# Patient Record
Sex: Female | Born: 1985
Health system: Southern US, Community
[De-identification: ages and names within clinical notes are randomized; demographics above are authoritative.]

## PROBLEM LIST (undated history)

## (undated) DIAGNOSIS — K30 Functional dyspepsia: Secondary | ICD-10-CM

## (undated) DIAGNOSIS — J302 Other seasonal allergic rhinitis: Secondary | ICD-10-CM

## (undated) DIAGNOSIS — R5383 Other fatigue: Secondary | ICD-10-CM

## (undated) DIAGNOSIS — Z8719 Personal history of other diseases of the digestive system: Secondary | ICD-10-CM

## (undated) DIAGNOSIS — G43909 Migraine, unspecified, not intractable, without status migrainosus: Secondary | ICD-10-CM

## (undated) DIAGNOSIS — K219 Gastro-esophageal reflux disease without esophagitis: Secondary | ICD-10-CM

## (undated) DIAGNOSIS — E559 Vitamin D deficiency, unspecified: Secondary | ICD-10-CM

## (undated) DIAGNOSIS — F419 Anxiety disorder, unspecified: Secondary | ICD-10-CM

## (undated) HISTORY — DX: Other fatigue: R53.83

## (undated) HISTORY — DX: Anxiety disorder, unspecified: F41.9

## (undated) HISTORY — PX: DILATION AND CURETTAGE OF UTERUS: SHX78

## (undated) HISTORY — DX: Other seasonal allergic rhinitis: J30.2

## (undated) HISTORY — DX: Functional dyspepsia: K30

## (undated) HISTORY — DX: Migraine, unspecified, not intractable, without status migrainosus: G43.909

## (undated) HISTORY — DX: Vitamin D deficiency, unspecified: E55.9

---

## 2007-09-10 ENCOUNTER — Emergency Department: Payer: Self-pay | Admitting: Emergency Medicine

## 2009-01-21 ENCOUNTER — Inpatient Hospital Stay: Payer: Self-pay

## 2013-02-19 ENCOUNTER — Ambulatory Visit: Payer: Self-pay | Admitting: Obstetrics and Gynecology

## 2013-02-19 LAB — CBC
HCT: 37.2 % (ref 35.0–47.0)
MCHC: 34 g/dL (ref 32.0–36.0)
MCV: 97 fL (ref 80–100)
Platelet: 261 10*3/uL (ref 150–440)
RBC: 3.84 10*6/uL (ref 3.80–5.20)
WBC: 7.1 10*3/uL (ref 3.6–11.0)

## 2013-11-02 ENCOUNTER — Ambulatory Visit: Payer: Self-pay | Admitting: Obstetrics and Gynecology

## 2013-11-02 LAB — HEMOGLOBIN: HGB: 12.4 g/dL (ref 12.0–16.0)

## 2013-11-07 LAB — PATHOLOGY REPORT

## 2014-02-11 HISTORY — PX: ESOPHAGOGASTRODUODENOSCOPY: SHX1529

## 2014-02-25 ENCOUNTER — Ambulatory Visit: Payer: Self-pay | Admitting: Gastroenterology

## 2014-09-13 NOTE — L&D Delivery Note (Signed)
Delivery Note At  a viable and healthy female (Carolyn Barnes)was delivered via  (Presentation:OA ;  ).  APGAR:8 ,9 ; weight  .   Placenta status:delivered ,with three vessel Cord:  with the following complications:none .  Anesthesia:  none Episiotomy:  none Lacerations:  none Est. Blood Loss (mL):  250  Mom to postpartum.  Baby to Couplet care / Skin to Skin.  Melody Burr 05/30/2015, 12:16 AM

## 2014-11-05 LAB — OB RESULTS CONSOLE HGB/HCT, BLOOD
HEMATOCRIT: 36 %
Hemoglobin: 12 g/dL

## 2014-11-05 LAB — OB RESULTS CONSOLE VARICELLA ZOSTER ANTIBODY, IGG: Varicella: IMMUNE

## 2014-11-05 LAB — OB RESULTS CONSOLE RUBELLA ANTIBODY, IGM: Rubella: IMMUNE

## 2014-11-05 LAB — OB RESULTS CONSOLE ABO/RH: RH Type: POSITIVE

## 2014-11-05 LAB — OB RESULTS CONSOLE HIV ANTIBODY (ROUTINE TESTING): HIV: NONREACTIVE

## 2014-11-05 LAB — OB RESULTS CONSOLE HEPATITIS B SURFACE ANTIGEN: Hepatitis B Surface Ag: NEGATIVE

## 2014-11-05 LAB — OB RESULTS CONSOLE GC/CHLAMYDIA
Chlamydia: NEGATIVE
GC PROBE AMP, GENITAL: NEGATIVE

## 2014-11-05 LAB — OB RESULTS CONSOLE PLATELET COUNT: Platelets: 242 10*3/uL

## 2014-11-05 LAB — OB RESULTS CONSOLE RPR: RPR: NONREACTIVE

## 2014-11-05 LAB — OB RESULTS CONSOLE ANTIBODY SCREEN: ANTIBODY SCREEN: NEGATIVE

## 2014-12-11 ENCOUNTER — Encounter: Payer: Self-pay | Admitting: *Deleted

## 2015-01-03 NOTE — Op Note (Signed)
PATIENT NAME:  Carolyn Barnes, Carolyn Barnes MR#:  [id number] DATE OF BIRTH:  1985/12/18  DATE OF PROCEDURE:  02/19/2013  PREOPERATIVE DIAGNOSES:  1. Missed abortion.  2. Rh positive blood type.   POSTOPERATIVE DIAGNOSES:  1. Missed abortion.  2. Rh positive blood type.  3. Incidental cervical laceration.   OPERATION: Suction dilation and curettage and repair of cervical laceration.   SURGEON: Alanda Slim. Sundra Haddix, MD  FIRST ASSISTANT: Imogene Burn, PA-S   ANESTHESIA: General.   INDICATIONS: The patient is a 29 year old African-American female, gravida 2, para 1-0-0-1, at 12+ weeks' gestation, who presents for surgical management of missed abortion. The patient had empty gestational sac on ultrasound along with quantitative hCG titer of 9800. The patient did have some mild cramping and spotting. She did not pass tissue.   FINDINGS AT SURGERY: Revealed uterus that was approximately 10-week size in mid plane. The uterus sounded to 12 cm. Average amount of POC was obtained on curettage. The os was open to #8 [place] dilator.   DESCRIPTION OF PROCEDURE: The patient was brought to the operating room, where she was placed in the supine position. General anesthesia was induced without difficulty. She was placed in dorsal lithotomy position using the candy-cane stirrups. A Betadine perineal and intravaginal prep and drape was performed in the standard fashion. Red Robinson catheter was used to drain 50 mL of urine from the bladder. A weighted speculum was placed into the vagina, and a single-tooth tenaculum was placed on the anterior lip of the cervix. The uterus was sounded to 12 cm. The Hanks dilators up to a #20 [place] caliber were used to dilate the endocervical canal. A #10 suction curette was used with several passes to remove the products of conception. During the curettage, the cervix was noted to be soft and friable, and several lacerations were noted with tenaculum placement. These had moderate  amount of bleeding which required 2-0 chromic suturing at the end of the case. Good hemostasis was obtained. Once completed with the procedure and noting that hemostasis was achieved, the procedure was terminated, with all instrumentation being removed from the vagina. The patient was then awakened, mobilized and taken to the recovery room in satisfactory condition.   ESTIMATED BLOOD LOSS: 150 mL.   INTRAVENOUS FLUIDS: 800 mL.   URINE OUTPUT: 50 mL.  ALL INSTRUMENTS, NEEDLE AND SPONGE COUNTS: Verified as correct.    ____________________________ Alanda Slim. Anchor Dwan, MD mad:OSi D: 02/19/2013 18:59:13 ET T: 02/20/2013 07:05:08 ET JOB#: [id number]  cc: Hassell Done A. Ashon Rosenberg, MD, <Dictator> [facility] MD ELECTRONICALLY SIGNED 02/20/2013 15:06

## 2015-01-04 NOTE — Op Note (Signed)
PATIENT NAME:  Carolyn Barnes, ABUNDIS MR#:  [id number] DATE OF BIRTH:  August 20, 1986  DATE OF PROCEDURE:  11/02/2013  PREOPERATIVE DIAGNOSIS: Missed abortion at 5.5 weeks' gestation.   POSTOPERATIVE DIAGNOSIS: Missed abortion at 5.5 weeks' gestation.   OPERATION: Suction dilation and curettage.   ANESTHESIA: General LMA.   SURGEON: Leldon Steege S. Marcelline Mates, M.D.   ESTIMATED BLOOD LOSS: 150 mL.   OPERATIVE FLUIDS: 800 mL.   URINE OUTPUT: 100 mL.   COMPLICATIONS: There were none.   FINDINGS: Included products of conception and uterus sounding to approximately 9 cm.   SPECIMENS: Included products of conception.   CONDITION: Stable.    PROCEDURE: The patient was taken to the operating room where she was placed under general anesthesia without difficulty. She was then prepped and draped in a sterile fashion and placed in the dorsal lithotomy position. A sterile speculum was then placed into the patient's vagina. After this, a single-tooth tenaculum was used to grasp the anterior lip of the cervix, and the cervical os was dilated up to approximately 18. A size 6 curette was then passed into the uterus, and suction device was activated with moderate return of products of conception. The suction device was then removed. A sharp curettage was performed until a gritty texture was noted in the uterine cavity. The suction curette was then reintroduced into the uterine cavity to evacuate all remaining products of conception. After this, the suction device was again removed. The tenaculum was removed, and excellent hemostasis was noted. The patient was then taken to the recovery room in stable condition. She tolerated the procedure well.   ____________________________ Chesley Noon. Marcelline Mates, MD asc:gb D: 11/02/2013 19:20:21 ET T: 11/02/2013 21:11:34 ET JOB#: [id number]  cc: Chesley Noon. Marcelline Mates, MD, <Dictator> Augusto Gamble MD ELECTRONICALLY SIGNED 11/12/2013 18:42

## 2015-02-14 ENCOUNTER — Other Ambulatory Visit: Payer: Self-pay | Admitting: Obstetrics and Gynecology

## 2015-02-14 DIAGNOSIS — O2242 Hemorrhoids in pregnancy, second trimester: Secondary | ICD-10-CM

## 2015-02-14 MED ORDER — HYDROCORTISONE 2.5 % RE CREA: 1.0000 "application " | TOPICAL_CREAM | Freq: Two times a day (BID) | RECTAL | Status: DC

## 2015-03-03 ENCOUNTER — Encounter: Payer: Self-pay | Admitting: *Deleted

## 2015-03-07 ENCOUNTER — Ambulatory Visit (INDEPENDENT_AMBULATORY_CARE_PROVIDER_SITE_OTHER): Payer: 59 | Admitting: Obstetrics and Gynecology

## 2015-03-07 ENCOUNTER — Encounter: Payer: Self-pay | Admitting: Obstetrics and Gynecology

## 2015-03-07 ENCOUNTER — Other Ambulatory Visit: Payer: 59

## 2015-03-07 VITALS — BP 98/60 | HR 100 | Ht <= 58 in | Wt 164.0 lb

## 2015-03-07 DIAGNOSIS — K5900 Constipation, unspecified: Secondary | ICD-10-CM

## 2015-03-07 DIAGNOSIS — K5909 Other constipation: Secondary | ICD-10-CM | POA: Insufficient documentation

## 2015-03-07 DIAGNOSIS — Z369 Encounter for antenatal screening, unspecified: Secondary | ICD-10-CM

## 2015-03-07 DIAGNOSIS — Z3483 Encounter for supervision of other normal pregnancy, third trimester: Secondary | ICD-10-CM

## 2015-03-07 DIAGNOSIS — Z131 Encounter for screening for diabetes mellitus: Secondary | ICD-10-CM

## 2015-03-07 DIAGNOSIS — Z3493 Encounter for supervision of normal pregnancy, unspecified, third trimester: Secondary | ICD-10-CM

## 2015-03-07 DIAGNOSIS — R6252 Short stature (child): Secondary | ICD-10-CM

## 2015-03-07 LAB — POCT URINALYSIS DIPSTICK
Bilirubin, UA: NEGATIVE
Blood, UA: NEGATIVE
KETONES UA: NEGATIVE
Leukocytes, UA: NEGATIVE
Nitrite, UA: NEGATIVE
PROTEIN UA: NEGATIVE
Urobilinogen, UA: 0.2
pH, UA: 6

## 2015-03-07 MED ORDER — TETANUS-DIPHTH-ACELL PERTUSSIS 5-2.5-18.5 LF-MCG/0.5 IM SUSP
0.5000 mL | Freq: Once | INTRAMUSCULAR | Status: AC
  Administered 2015-03-07: 0.5 mL via INTRAMUSCULAR

## 2015-03-07 NOTE — Patient Instructions (Signed)
  Place 24-38 weeks prenatal visit patient instructions here.

## 2015-03-07 NOTE — Progress Notes (Signed)
Pt is still fainting, having some swelling b ankles, hands

## 2015-03-07 NOTE — Progress Notes (Signed)
ROB & glucola- doing well with no major changes; Tdap Given and blood consent signed; plans vasectomy for contraception PP; recommend compression socks for swelling; plans to name infant "Eden"

## 2015-03-08 LAB — GLUCOSE, 1 HOUR GESTATIONAL: Gestational Diabetes Screen: 106 mg/dL (ref 65–139)

## 2015-03-08 LAB — HEMOGLOBIN AND HEMATOCRIT, BLOOD
Hematocrit: 31.9 % — ABNORMAL LOW (ref 34.0–46.6)
Hemoglobin: 10.9 g/dL — ABNORMAL LOW (ref 11.1–15.9)

## 2015-03-14 ENCOUNTER — Telehealth: Payer: Self-pay | Admitting: *Deleted

## 2015-03-14 ENCOUNTER — Other Ambulatory Visit: Payer: Self-pay | Admitting: Obstetrics and Gynecology

## 2015-03-14 DIAGNOSIS — M5430 Sciatica, unspecified side: Secondary | ICD-10-CM

## 2015-03-14 NOTE — Telephone Encounter (Signed)
Notified pt of referral being put in

## 2015-03-14 NOTE — Telephone Encounter (Signed)
Pt is having B hip pain, R is worse, c/o sciatic nerve pain, has been to chiropractor no relief, is wondering what your thoughts were about a pt referral???

## 2015-03-14 NOTE — Telephone Encounter (Signed)
Please let her know I put an order in for PT

## 2015-03-18 NOTE — Telephone Encounter (Signed)
Referral sent 

## 2015-03-21 ENCOUNTER — Ambulatory Visit (INDEPENDENT_AMBULATORY_CARE_PROVIDER_SITE_OTHER): Payer: 59 | Admitting: Obstetrics and Gynecology

## 2015-03-21 ENCOUNTER — Encounter: Payer: Self-pay | Admitting: Obstetrics and Gynecology

## 2015-03-21 VITALS — BP 111/70 | HR 85 | Wt 164.6 lb

## 2015-03-21 DIAGNOSIS — Z3493 Encounter for supervision of normal pregnancy, unspecified, third trimester: Secondary | ICD-10-CM

## 2015-03-21 LAB — POCT URINALYSIS DIPSTICK
BILIRUBIN UA: NEGATIVE
Blood, UA: NEGATIVE
GLUCOSE UA: NEGATIVE
Ketones, UA: NEGATIVE
Leukocytes, UA: NEGATIVE
Nitrite, UA: NEGATIVE
PH UA: 7
Spec Grav, UA: 1.01
Urobilinogen, UA: 0.2

## 2015-03-21 NOTE — Progress Notes (Signed)
ROB- doing well, no new concerns

## 2015-03-21 NOTE — Progress Notes (Signed)
Pt is having pain in her hip, is going to PT on 03/24/2015, has been on vacation and is feeling great

## 2015-03-24 ENCOUNTER — Ambulatory Visit: Payer: 59 | Attending: Obstetrics and Gynecology | Admitting: Physical Therapy

## 2015-03-24 ENCOUNTER — Encounter: Payer: Self-pay | Admitting: Physical Therapy

## 2015-03-24 DIAGNOSIS — M25551 Pain in right hip: Secondary | ICD-10-CM | POA: Insufficient documentation

## 2015-03-24 DIAGNOSIS — M5440 Lumbago with sciatica, unspecified side: Secondary | ICD-10-CM | POA: Insufficient documentation

## 2015-03-24 NOTE — Therapy (Signed)
[place] MAIN [facility] SERVICES [address] [place], [place], [address] Phone: [phone]   Fax:  [phone]  Physical Therapy Evaluation  Patient Details  Name: Carolyn Barnes MRN: [id number] Date of Birth: [birth year] Referring Provider:  Evonnie Pat, CNM  Encounter Date: 03/24/2015      PT End of Session - 03/24/15 1727    Visit Number 1   Number of Visits 7   Date for PT Re-Evaluation 04/21/15   PT Start Time 1604   PT Stop Time 1702   PT Time Calculation (min) 58 min   Equipment Utilized During Treatment Gait belt   Activity Tolerance Patient tolerated treatment well   Behavior During Therapy [facility] for tasks assessed/performed      Past Medical History  Diagnosis Date  . Migraine headache   . Seasonal allergies   . Anxiety   . Fatigue   . Indigestion   . Vitamin D deficiency disease     Past Surgical History  Procedure Laterality Date  . Dilation and curettage of uterus  02/2013,10/2013    There were no vitals filed for this visit.  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 Pt reports that she has pain in both posterior hips R>L 4/10 upon arrival, 8-9/10 at worst, Nothing seems to relieve pain, but it is inconsistent. Pt reports that she had sciatic nerve pain with last child 6 years ago. Has had multiple flare ups within the past 6 years; treatment from the Chiropractor, some relief noted. New Sciatic pain noted for the past 10 weeks, 3 visits to the chiropracter, no benefit from  that treatment. Seeking PT as an alternate conservative treatment option.   Pertinent History History of sciatic nerve pain.  CVAs  within 1 month in 2015. Some numbness noted in the RLE    Limitations Sitting;Lifting;Standing;Walking   How long can you sit comfortably? 10-15 minutes    How long can you stand comfortably? 5-10 mintues    How long can you walk comfortably? 15 minutes.    Diagnostic tests Cervical Vertebra imaging - none for the low back    Patient Stated Goals stand longer without pain - education to manage pain .    Currently in Pain? Yes   Pain Score 4    Pain Location Hip   Pain Orientation Right;Posterior   Pain Descriptors / Indicators Stabbing;Sharp   Pain Type Acute pain;Chronic pain   Pain Radiating Towards lateral aspect of both lower extremities.    Pain Onset More than a month ago   Pain Frequency Intermittent   Aggravating Factors  walking, sleeping on side    Effect of Pain on Daily Activities patient has to sit rather than stand at work    Multiple Pain Sites Yes   Pain Score 2   Pain Location Hip   Pain Orientation Left;Posterior   Pain Descriptors / Indicators Sharp   Pain Onset More than a month ago   Pain Frequency Intermittent            [facility] PT Assessment - 03/25/15 0952    Assessment   Medical Diagnosis sciatica  Onset Date/Surgical Date 01/13/15   Hand Dominance Right   Next MD Visit 04/04/2015    Prior Therapy Chiropractic treatment, some relief in the past, no relief for this round. Denies any history of physical therapy for this condition;     Restrictions   Other Position/Activity Restrictions self restricted to no more than 15 lbs, due to pregnancy.    Home Environment   Living Environment Private residence   Living Arrangements Spouse/significant other;Children   Available Help at Discharge Family   Type of [place] to enter   Entrance Stairs-Number of Steps 12   Entrance Stairs-Rails Right;Left   Home Layout Two level   Alternate Level Stairs-Number of Steps 10   Alternate Level Stairs-Rails Right;Left   Prior Function    Level of Independence Independent   Vocation Full time employment   Vocation Requirements intake patients at [employer] staying busy at work or with children    Cognition   Overall Cognitive Status Within Functional Limits for tasks assessed   Observation/Other Assessments   Modified Oswert[place] 34% impaired. Moderate disability.    Lower Extremity Functional Scale  38/80 Lower the score, greater disability    Sensation   Light Touch Appears Intact   Additional Comments some tingling occurs in Bilateral LE in toes, R LE more affected than L.    Posture/Postural Control   Posture Comments Slight forward head, slight increased lumbar lordosis. wide BOS in stance.    Strength   Overall Strength Comments 4+ to 5/5 throughout UE and LE bilaterally, pain noted with R LE MMT especially hip movements    Palpation   SI assessment  Increased mobility on R, tenderness to touch .    Palpation comment tenderness at the R SI joint and at the piriformis. SLR Positive on R. Pt notes slight decrease in pain with R LE distraction. Pain is decreased with SI/pelvic compression belt.    Special Tests    Special Tests Lumbar   Straight Leg Raise   Findings Positive   Side  Right   Comment approx 40degrees   Pelvic Dictraction   Findings Negative   Pelvic Compression   Findings Positive   Side Right   comment some pain noted on L but more intense on R   Sacral thrust    Findings Unable to test   Gaenslen's test   Findings Positive   Side  Right   Piriformis Test   Findings Positive   Side  Right   Comments moderate increase in pain on R, only discomfort noted on L    Transfers   Comments Independent with all transfers.    Ambulation/Gait   Gait Comments Patient ambulates with wide BOS, normal step length, and reciprocol pattern. Pelvic drop on the L with right stance noted.    High Level Balance   High Level Balance Comments Balance is within normal limits for ADLs including gait and stair  negotiation.                            PT Education - 03/24/15 1726    Education provided Yes   Education Details Plan of care, benefit and difference between an SI joint belt and pregnancy belt   Person(s) Educated Patient   Methods Explanation   Comprehension Verbalized understanding             PT Long Term Goals -  03/24/15 1747    PT LONG TERM GOAL #1   Title Patient will be independent with HEP to improve function within the work environment by 04/21/2015   Time 4   Period Weeks   Status New   PT LONG TERM GOAL #2   Title Patient will improve LEFS to 60/80 to demonstrate decreased disability by 04/21/2015   Time 4   Period Weeks   Status New   PT LONG TERM GOAL #3   Title Patient will improve Modified Oswestry to <20 impaired to demonstrate decreaesd pain and improved function by 04/21/2015   PT LONG TERM GOAL #4   Title Patient will be able to walk up and down a flight of stairs with only slight increase in pain to allow greater ease of access to home environment by 04/21/2015   Time 4   Period Weeks   Status New               Plan - 03/24/15 1728    Clinical Impression Statement Patient is a pleasant 29 year old female in her 30th week of pregnancy. Patient demonstrates pain in posterior R hip region 4/10 upon eval, but states that with movement both hips hurt; this has been a problem for roughly 2 months. Through eval, Pt was found to have positive slump test  on R, positive SLR on R, Positive R piriformis test, positive R galensens test as well as Postive SI compression test, increaesed pain with repeated extension; Special tests increased hip pain to 6/10 in R and 4/10 in L. Tenderness was also noted in R SI and piriformis through palpation. Pt demonstrated no numbness tingling at time of assessment and showed no signs of extremity weakness bilaterally. Increased SI joint mobility noted through evaluaiton leading to posterion hip pain that radiates  down lateral LE.  Patient would benefit from skilled PT to address hypermobility and reduce pain for return to PLOF.   Pt will benefit from skilled therapeutic intervention in order to improve on the following deficits Hypermobility;Pain;Decreased mobility;Decreased activity tolerance;Difficulty walking;Postural dysfunction;Improper body mechanics   Rehab Potential Good   Clinical Impairments Affecting Rehab Potential positive: motivation, relief in the past, young in age; Negative: SubAcute on chronic condition.    PT Frequency 1x / week  2x per week for the first week then once a week for the remaining weeks.    PT Duration 4 weeks   PT Treatment/Interventions ADLs/Self Care Home Management;Moist Heat;Cryotherapy;Gait training;Stair training;Functional mobility training;Therapeutic activities;Therapeutic exercise;Neuromuscular re-education;Manual techniques;Passive range of motion;Taping   PT Next Visit Plan core stabilization exercises. SI joint    PT Home Exercise Plan will address at next visit   Consulted and Agree with Plan of Care Patient         Problem List Patient Active Problem List   Diagnosis Date Noted  . Chronic constipation 03/07/2015  . Short stature 03/07/2015  Barrie Folk, SPT This entire session was performed under direct supervision and direction of a licensed therapist . I have personally read, edited and approve of the note as written.   Hopkins,Margaret, PT, DPT 03/25/2015, 10:12 AM  [place] MAIN [facility] SERVICES [address] [place], [place], [address] Phone: [phone]   Fax:  [phone]

## 2015-03-31 ENCOUNTER — Encounter: Payer: Self-pay | Admitting: Physical Therapy

## 2015-03-31 ENCOUNTER — Ambulatory Visit: Payer: 59 | Admitting: Physical Therapy

## 2015-03-31 DIAGNOSIS — M5440 Lumbago with sciatica, unspecified side: Secondary | ICD-10-CM

## 2015-03-31 DIAGNOSIS — M25551 Pain in right hip: Secondary | ICD-10-CM

## 2015-03-31 NOTE — Patient Instructions (Signed)
HIP / KNEE: Extension - Standing   Squeeze glutes. Raise and lift leg backward. Keep knee straight or slightly bent. _12__ reps per set, __2_ sets per day, __5_ days per week Hold onto a support.  Copyright  VHI. All rights reserved.  HIP: Abduction - Standing   Squeeze glutes. Raise leg out and slightly back. _12__ reps per set, __2_ sets per day, __5_ days per week Hold onto a support.  Copyright  VHI. All rights reserved.

## 2015-03-31 NOTE — Therapy (Addendum)
[place] MAIN [facility] SERVICES [address] [place], [place], [address] Phone: [phone]   Fax:  [phone]  Physical Therapy Treatment  Patient Details  Name: Carolyn Barnes MRN: [id number] Date of Birth: 04/10/1986 Referring Provider:  Evonnie Pat, CNM  Encounter Date: 03/31/2015      PT End of Session - 03/31/15 1715    Visit Number 2   Number of Visits 7   Date for PT Re-Evaluation 04/21/15   PT Start Time 0420   PT Stop Time 0505   PT Time Calculation (min) 45 min   Equipment Utilized During Treatment Gait belt   Activity Tolerance Patient tolerated treatment well   Behavior During Therapy [facility] for tasks assessed/performed      Past Medical History  Diagnosis Date  . Migraine headache   . Seasonal allergies   . Anxiety   . Fatigue   . Indigestion   . Vitamin D deficiency disease     Past Surgical History  Procedure Laterality Date  . Dilation and curettage of uterus  02/2013,10/2013    There were no vitals filed for this visit.  Visit Diagnosis:  Midline low back pain with sciatica, sciatica laterality unspecified  Hip pain, right      Subjective Assessment - 03/31/15 1639    Subjective Patient reports pain 2/10 upon arrival to PT. Patient reports that pain increased to 8/10 after going to the park, approximately 30 minutes before pain was 8/10.     Pertinent History History of sciatic nerve pain.  CVAs within 1 month in 2015. Some numbness noted in the RLE    Limitations Lifting;Standing;Walking   How long can you sit comfortably? 10-15 minutes    How long can you stand comfortably? 5-10 mintues    How long can you walk comfortably? 15 minutes.    Diagnostic tests Cervical Vertebra imaging - none for the low back    Patient Stated Goals stand longer without pain - education to manage pain .    Currently in Pain? Yes   Pain Score 2    Pain Location Hip   Pain Orientation Right;Proximal   Pain Descriptors  / Indicators Aching   Pain Type Acute pain;Chronic pain   Pain Onset More than a month ago   Pain Frequency Intermittent   Aggravating Factors  walking, sleeping on side    Effect of Pain on Daily Activities sit rather than stand at work.    Pain Onset More than a month ago       Treatment:   Core stabilization on small therapy ball.   Pelvic tilts x30 seconds.  Seated hip marches 2x10 Seated knee extension x12  Hip abduction with red tband 2x12  Trunk rotation to midline x10 each direction.   CGA and moderate verbal instruction provided to increase diaphragmatic breathing, And prevent accessory breathing.   Standing hip exercise  Red tband hip abduction x12  Red tband hip extension x12    Moderate Cues given to facilitate to decrease compensatory motions that increase pelvic rotation, cues also given to increase diaphragmatic breathing.  Pt reports slight increase in R LE pain s/p completion of R standing hip exercises.   Patient education provided for proper body mechanics for ADLs such as reaching and lifting. Education also provided with bed mobility mechanics to prevent excessive pelvic rotation, including placing a pillow between legs with sidelying to facilitate neutral hip placement.  3 repetitions of sit<>supine  3 repetitions of  supine<>sidelying.   CGA. Moderate verbal instruction for log roll technique.                          PT Education - 03/31/15 1710    Education provided Yes   Education Details Core stabilization exercise, body mechanics with ADLs and sleeping.    Person(s) Educated Patient   Methods Demonstration;Tactile cues;Explanation;Verbal cues   Comprehension Verbalized understanding;Returned demonstration;Verbal cues required;Tactile cues required             PT Long Term Goals - 03/24/15 1747    PT LONG TERM GOAL #1   Title Patient will be independent with HEP to improve function within the work environment by  04/21/2015   Time 4   Period Weeks   Status New   PT LONG TERM GOAL #2   Title Patient will improve LEFS to 60/80 to demonstrate decreased disability by 04/21/2015   Time 4   Period Weeks   Status New   PT LONG TERM GOAL #3   Title Patient will improve Modified Oswestry to <20 impaired to demonstrate decreaesd pain and improved function by 04/21/2015   PT LONG TERM GOAL #4   Title Patient will be able to walk up and down a flight of stairs with only slight increase in pain to allow greater ease of access to home environment by 04/21/2015   Time 4   Period Weeks   Status New               Plan - 03/31/15 1715    Clinical Impression Statement Patient presents to PT with slight Pain in R hip on this day 2/10. Patient instructed in core stabilization exercises on this day; CGA and moderate verbal instruction provided to increase diaphragmatic breathing. Cues also given to facilitate to decrease compensatory motions that increase pelvic rotation. PT provided education for proper body mechanics with ADLs, bending, reaching, getting into/out of bed and sleeping position. Patient reports slight increase in pain with standing exercises. Continued Skilled PT is recommended to decrease hypermobility and reduce pain to return to PLOF.    Pt will benefit from skilled therapeutic intervention in order to improve on the following deficits Hypermobility;Pain;Decreased mobility;Decreased activity tolerance;Difficulty walking;Postural dysfunction;Improper body mechanics   Rehab Potential Good   Clinical Impairments Affecting Rehab Potential positive: motivation, relief in the past, young in age; Negative: SubAcute on chronic condition.    PT Frequency 1x / week  2x per week for the first week then once a week for the remaining weeks.    PT Duration 4 weeks   PT Treatment/Interventions ADLs/Self Care Home Management;Moist Heat;Cryotherapy;Gait training;Stair training;Functional mobility training;Therapeutic  activities;Therapeutic exercise;Neuromuscular re-education;Manual techniques;Passive range of motion;Taping   PT Next Visit Plan core stabilization exercises. progress HEP.    PT Home Exercise Plan See patient instructions.    Consulted and Agree with Plan of Care Patient        Problem List Patient Active Problem List   Diagnosis Date Noted  . Chronic constipation 03/07/2015  . Short stature 03/07/2015   Barrie Folk SPT 04/01/2015   8:48 AM  This entire session was performed under direct supervision and direction of a licensed therapist . I have personally read, edited and approve of the note as written.  Hopkins,Margaret, PT, DPT 04/01/2015, 8:48 AM  [place] MAIN [facility] SERVICES [address] [place], [place], [address] Phone: [phone]   Fax:  [phone]

## 2015-04-04 ENCOUNTER — Encounter: Payer: Self-pay | Admitting: Obstetrics and Gynecology

## 2015-04-04 ENCOUNTER — Ambulatory Visit (INDEPENDENT_AMBULATORY_CARE_PROVIDER_SITE_OTHER): Payer: 59 | Admitting: Obstetrics and Gynecology

## 2015-04-04 VITALS — BP 102/60 | Wt 166.3 lb

## 2015-04-04 DIAGNOSIS — Z3493 Encounter for supervision of normal pregnancy, unspecified, third trimester: Secondary | ICD-10-CM

## 2015-04-04 LAB — POCT URINALYSIS DIPSTICK
GLUCOSE UA: NEGATIVE
Ketones, UA: 5
Nitrite, UA: NEGATIVE
PH UA: 7
RBC UA: NEGATIVE
Spec Grav, UA: 1.01
Urobilinogen, UA: 0.2

## 2015-04-04 NOTE — Progress Notes (Signed)
ROB- doing better;attended water birth class;

## 2015-04-04 NOTE — Progress Notes (Signed)
Pt states PT is helping her hips, would like to discuss labor

## 2015-04-07 ENCOUNTER — Encounter: Payer: Self-pay | Admitting: Physical Therapy

## 2015-04-07 ENCOUNTER — Ambulatory Visit: Payer: 59 | Admitting: Physical Therapy

## 2015-04-07 DIAGNOSIS — M25551 Pain in right hip: Secondary | ICD-10-CM

## 2015-04-07 DIAGNOSIS — M5440 Lumbago with sciatica, unspecified side: Secondary | ICD-10-CM

## 2015-04-07 NOTE — Patient Instructions (Signed)
Bridge   Lie back, legs bent. Inhale, pressing hips up. Keeping ribs in, lengthen lower back. Exhale, on downward motion  Repeat __10__ times. Do _2___ sessions per day.  Copyright  VHI. All rights reserved.

## 2015-04-07 NOTE — Therapy (Signed)
[place] MAIN [facility] SERVICES [address] [place], [place], [address] Phone: [phone]   Fax:  [phone]  Physical Therapy Treatment  Patient Details  Name: Carolyn Barnes MRN: [id number] Date of Birth: May 21, 1986 Referring Provider:  Evonnie Pat, CNM  Encounter Date: 04/07/2015      PT End of Session - 04/07/15 0944    Visit Number 3   Number of Visits 7   Date for PT Re-Evaluation 04/21/15   PT Start Time 0900   PT Stop Time 0931   PT Time Calculation (min) 31 min   Equipment Utilized During Treatment Gait belt   Activity Tolerance Patient tolerated treatment well   Behavior During Therapy [facility] for tasks assessed/performed      Past Medical History  Diagnosis Date  . Migraine headache   . Seasonal allergies   . Anxiety   . Fatigue   . Indigestion   . Vitamin D deficiency disease     Past Surgical History  Procedure Laterality Date  . Dilation and curettage of uterus  02/2013,10/2013    There were no vitals filed for this visit.  Visit Diagnosis:  Midline low back pain with sciatica, sciatica laterality unspecified  Hip pain, right      Subjective Assessment - 04/07/15 0902    Subjective Patient reports pain 6/10 upon arrival this AM. States that she was on her feet for approx 10 hours yesterday with a baby shower.    Pertinent History History of sciatic nerve pain.  Some numbness noted in the RLE    Limitations Lifting;Standing;Walking   How long can you sit comfortably? 10-15 minutes    How long can you stand comfortably? 5-10 mintues    How long can you walk comfortably? 15 minutes.    Diagnostic tests Cervical Vertebra imaging - none for the low back    Patient Stated Goals stand longer without pain - education to manage pain .    Currently in Pain? Yes   Pain Score 6    Pain Location Hip   Pain Orientation Posterior;Right   Pain Descriptors / Indicators Aching   Pain Type Acute pain;Chronic pain   Pain Onset More than a month ago   Pain Frequency Intermittent   Effect of Pain on Daily Activities walking    Multiple Pain Sites No   Pain Onset More than a month ago      Treatment:   Seated stabilization exercises on therapy ball.  Pelvic tilts 2x30 seconds  Marching 2x12  Knee extension 2x12  Hip abduction red tband 2x12   PT provided min verbal and tactile instruction to increase diaphragmatic breathing, increased core stabilization, and proper weight shift on therapy ball.    Supine bridges 3 second hold 2x10  Verbal instruction for proper diaphragmatic breathing and decreased speed of movement.    Bed mobility training  Sitting to prone x3   Verbal instruction for proper sequencing of movements, and log roll technique, especially with sit to supine.   Slight increase in pain noted s/p therapeutic exercises from 6/10 to 7/10.                              PT Education - 04/07/15 [phone]    Education provided Yes   Education Details HEP review/advancement, body mechanics with bed mobillity    Person(s) Educated Patient   Methods Explanation;Demonstration;Tactile cues;Verbal cues   Comprehension Verbalized understanding;Returned  demonstration;Verbal cues required             PT Long Term Goals - 03/24/15 1747    PT LONG TERM GOAL #1   Title Patient will be independent with HEP to improve function within the work environment by 04/21/2015   Time 4   Period Weeks   Status New   PT LONG TERM GOAL #2   Title Patient will improve LEFS to 60/80 to demonstrate decreased disability by 04/21/2015   Time 4   Period Weeks   Status New   PT LONG TERM GOAL #3   Title Patient will improve Modified Oswestry to <20 impaired to demonstrate decreaesd pain and improved function by 04/21/2015   PT LONG TERM GOAL #4   Title Patient will be able to walk up and down a flight of stairs with only slight increase in pain to allow greater ease of access to home  environment by 04/21/2015   Time 4   Period Weeks   Status New               Plan - 04/07/15 1024    Clinical Impression Statement Patient late to visit; Patient presents to PT with increased pain on this day 6/10 s/p being on feet all day with baby shower, but overall feels that PT is decreasing her pain. Patient re-educated in HEP  and instructed in core stabilization exercises on this day. Min Verbal instruction provided by PT with exercises and bed mobility on this day. Patient expresses slight increase pain s/p therapeutic exercises on this day. Continued skilled PT is recommended to decrease pain and return patient to PLOF.   Pt will benefit from skilled therapeutic intervention in order to improve on the following deficits Hypermobility;Pain;Decreased mobility;Decreased activity tolerance;Difficulty walking;Postural dysfunction;Improper body mechanics   Rehab Potential Good   Clinical Impairments Affecting Rehab Potential positive: motivation, relief in the past, young in age; Negative: SubAcute on chronic condition.    PT Frequency 1x / week  2x per week for the first week then once a week for the remaining weeks.    PT Duration 4 weeks   PT Treatment/Interventions ADLs/Self Care Home Management;Moist Heat;Cryotherapy;Gait training;Stair training;Functional mobility training;Therapeutic activities;Therapeutic exercise;Neuromuscular re-education;Manual techniques;Passive range of motion;Taping   PT Next Visit Plan increased Core stabilization.    PT Home Exercise Plan See patient instructions.    Consulted and Agree with Plan of Care Patient        Problem List Patient Active Problem List   Diagnosis Date Noted  . Chronic constipation 03/07/2015  . Short stature 03/07/2015   Barrie Folk SPT 04/07/2015   2:11 PM  This entire session was performed under direct supervision and direction of a licensed therapist. I have personally read, edited and approve of the note as  written.   Hopkins,Margaret 04/07/2015, 2:11 PM  [place] MAIN [facility] SERVICES [address] [place], [place], [address] Phone: [phone]   Fax:  [phone]

## 2015-04-08 ENCOUNTER — Telehealth: Payer: Self-pay | Admitting: *Deleted

## 2015-04-08 ENCOUNTER — Other Ambulatory Visit: Payer: Self-pay | Admitting: Obstetrics and Gynecology

## 2015-04-08 NOTE — Telephone Encounter (Signed)
Pt is c/o internal hemorrhoid pain, states she feels a bulge in her rectal area, states Saturday had large amount of blood with bowel movement She has increased her colace due to iron tablets causing constipation, didn't know if you had any other suggestions for her

## 2015-04-08 NOTE — Telephone Encounter (Signed)
Notified pt she voiced understanding 

## 2015-04-08 NOTE — Telephone Encounter (Signed)
Have her restart her proctofoam and use it 2 x day until symptoms improve. She should have refills at pharmacy if needed.

## 2015-04-09 NOTE — Telephone Encounter (Signed)
Notified pt she voiced understanding 

## 2015-04-10 ENCOUNTER — Encounter: Payer: Self-pay | Admitting: Obstetrics and Gynecology

## 2015-04-10 ENCOUNTER — Ambulatory Visit (INDEPENDENT_AMBULATORY_CARE_PROVIDER_SITE_OTHER): Payer: 59 | Admitting: Obstetrics and Gynecology

## 2015-04-10 ENCOUNTER — Other Ambulatory Visit: Payer: Self-pay | Admitting: Obstetrics and Gynecology

## 2015-04-10 VITALS — BP 120/70 | HR 88

## 2015-04-10 DIAGNOSIS — Z3493 Encounter for supervision of normal pregnancy, unspecified, third trimester: Secondary | ICD-10-CM

## 2015-04-10 DIAGNOSIS — O4703 False labor before 37 completed weeks of gestation, third trimester: Secondary | ICD-10-CM | POA: Diagnosis not present

## 2015-04-10 LAB — POCT URINALYSIS DIPSTICK
BILIRUBIN UA: NEGATIVE
Blood, UA: NEGATIVE
Glucose, UA: 100
Ketones, UA: NEGATIVE
Leukocytes, UA: NEGATIVE
Nitrite, UA: NEGATIVE
PH UA: 6
SPEC GRAV UA: 1.01
Urobilinogen, UA: 0.2

## 2015-04-10 MED ORDER — BETAMETHASONE SOD PHOS & ACET 6 (3-3) MG/ML IJ SUSP
12.0000 mg | Freq: Once | INTRAMUSCULAR | Status: AC
  Administered 2015-04-10: 12 mg via INTRAMUSCULAR

## 2015-04-10 NOTE — Progress Notes (Signed)
Subjective:  Carolyn Barnes is a 28 y.o. [phone] at [redacted]w[redacted]d being seen today for ongoing prenatal care.  Patient reports backache, occasional contractions and RLQ sharp pains while at work.   .   .  . Denies leaking of fluid.   The following portions of the patient's history were reviewed and updated as appropriate: allergies, current medications, past family history, past medical history, past social history, past surgical history and problem list.   Objective:   Filed Vitals:   04/10/15 1551  BP: 120/70  Pulse: 88    Fetal Status:           General:  Alert, oriented and cooperative. Patient is in no acute distress.  Skin: Skin is warm and dry. No rash noted.   Cardiovascular: Normal heart rate noted  Respiratory: Effort and breath sounds normal, no problems with respiration noted  Abdomen: Soft, gravid, appropriate for gestational age.       Vaginal:  .       Cervix: 1.5/80/-2, soft with head well applied  Extremities: Normal range of motion.     Mental Status: Normal mood and affect. Normal behavior. Normal judgment and thought content.   Urinalysis: Urine Protein: Trace Urine Glucose: 1+  Assessment and Plan:  Pregnancy: G4P1021 at [redacted]w[redacted]d  1. Preterm contractions, third trimester NST reactive with uterine irritability noted Pelvic rest and modified activity instituted 1st dose betamethasone given today & will return tomorrow for 2nd dose. PTL precautions reviewed. Will not work x 5 days with return on day 6 for 4h workdays if there is no cervical change, other wise will be OOW until delivery. GBS culture obtained.  2. Prenatal care, third trimester continue - POCT urinalysis dipstick   Preterm labor symptoms and general obstetric precautions including but not limited to vaginal bleeding, contractions, leaking of fluid and fetal movement were reviewed in detail with the patient.  Please refer to After Visit Summary for other counseling recommendations.   Return if  symptoms worsen or fail to improve.   Carolyn Barnes Carolyn Barnes, CNM

## 2015-04-10 NOTE — Patient Instructions (Addendum)
Preterm Labor Information Preterm labor is when labor starts at less than 37 weeks of pregnancy. The normal length of a pregnancy is 39 to 41 weeks. CAUSES Often, there is no identifiable underlying cause as to why a woman goes into preterm labor. One of the most common known causes of preterm labor is infection. Infections of the uterus, cervix, vagina, amniotic sac, bladder, kidney, or even the lungs (pneumonia) can cause labor to start. Other suspected causes of preterm labor include:   Urogenital infections, such as yeast infections and bacterial vaginosis.   Uterine abnormalities (uterine shape, uterine septum, fibroids, or bleeding from the placenta).   A cervix that has been operated on (it may fail to stay closed).   Malformations in the fetus.   Multiple gestations (twins, triplets, and so on).   Breakage of the amniotic sac.  RISK FACTORS 1. Having a previous history of preterm labor.  2. Having premature rupture of membranes (PROM).  3. Having a placenta that covers the opening of the cervix (placenta previa).  4. Having a placenta that separates from the uterus (placental abruption).  5. Having a cervix that is too weak to hold the fetus in the uterus (incompetent cervix).  6. Having too much fluid in the amniotic sac (polyhydramnios).  7. Taking illegal drugs or smoking while pregnant.  8. Not gaining enough weight while pregnant.  73. Being younger than 65 and older than 28 years old.  10. Having a low socioeconomic status.  63. Being African American. SYMPTOMS Signs and symptoms of preterm labor include:   Menstrual-like cramps, abdominal pain, or back pain.  Uterine contractions that are regular, as frequent as six in an hour, regardless of their intensity (may be mild or painful).  Contractions that start on the top of the uterus and spread down to the lower abdomen and back.   A sense of increased pelvic pressure.   A watery or bloody mucus  discharge that comes from the vagina.  TREATMENT Depending on the length of the pregnancy and other circumstances, your health care provider may suggest bed rest. If necessary, there are medicines that can be given to stop contractions and to mature the fetal lungs. If labor happens before 34 weeks of pregnancy, a prolonged hospital stay may be recommended. Treatment depends on the condition of both you and the fetus.  WHAT SHOULD YOU DO IF YOU THINK YOU ARE IN PRETERM LABOR? Call your health care provider right away. You will need to go to the hospital to get checked immediately. HOW CAN YOU PREVENT PRETERM LABOR IN FUTURE PREGNANCIES? You should:   Stop smoking if you smoke.  Maintain healthy weight gain and avoid chemicals and drugs that are not necessary.  Be watchful for any type of infection.  Inform your health care provider if you have a known history of preterm labor. Document Released: 11/20/2003 Document Revised: 05/02/2013 Document Reviewed: 10/02/2012 [facility] Patient Information 2015 [place], [place]. This information is not intended to replace advice given to you by your health care provider. Make sure you discuss any questions you have with your health care provider. Pelvic Rest Pelvic rest is sometimes recommended for women when:   The placenta is partially or completely covering the opening of the cervix (placenta previa).  There is bleeding between the uterine wall and the amniotic sac in the first trimester (subchorionic hemorrhage).  The cervix begins to open without labor starting (incompetent cervix, cervical insufficiency).  The labor is too early (preterm labor).  HOME CARE INSTRUCTIONS 12. Do not have sexual intercourse, stimulation, or an orgasm. 13. Do not use tampons, douche, or put anything in the vagina. 14. Do not lift anything over 10 pounds (4.5 kg). 15. Avoid strenuous activity or straining your pelvic muscles. SEEK MEDICAL CARE IF:  You have any  vaginal bleeding during pregnancy. Treat this as a potential emergency.  You have cramping pain felt low in the stomach (stronger than menstrual cramps).  You notice vaginal discharge (watery, mucus, or bloody).  You have a low, dull backache.  There are regular contractions or uterine tightening. SEEK IMMEDIATE MEDICAL CARE IF: You have vaginal bleeding and have placenta previa.  Document Released: 12/25/2010 Document Revised: 11/22/2011 Document Reviewed: 12/25/2010 [facility] Patient Information 2015 [place], [place]. This information is not intended to replace advice given to you by your health care provider. Make sure you discuss any questions you have with your health care provider.

## 2015-04-10 NOTE — Progress Notes (Signed)
Pt c/o just not feeling good, she is tearful, c/o R groin pain, pulling sensation, feels like she is just exhauseted

## 2015-04-11 ENCOUNTER — Other Ambulatory Visit: Payer: Self-pay | Admitting: *Deleted

## 2015-04-11 ENCOUNTER — Ambulatory Visit (INDEPENDENT_AMBULATORY_CARE_PROVIDER_SITE_OTHER): Payer: 59 | Admitting: Obstetrics and Gynecology

## 2015-04-11 VITALS — BP 114/60 | HR 89 | Wt 163.7 lb

## 2015-04-11 DIAGNOSIS — Z3493 Encounter for supervision of normal pregnancy, unspecified, third trimester: Secondary | ICD-10-CM

## 2015-04-11 DIAGNOSIS — Z3685 Encounter for antenatal screening for Streptococcus B: Secondary | ICD-10-CM

## 2015-04-11 MED ORDER — BETAMETHASONE SOD PHOS & ACET 6 (3-3) MG/ML IJ SUSP
12.0000 mg | Freq: Once | INTRAMUSCULAR | Status: AC
  Administered 2015-04-11: 12 mg via INTRAMUSCULAR

## 2015-04-11 NOTE — Progress Notes (Cosign Needed)
Pt is here for 2nd betamethasone inj, she is following [facility] orders

## 2015-04-14 ENCOUNTER — Encounter: Payer: Self-pay | Admitting: Physical Therapy

## 2015-04-14 ENCOUNTER — Ambulatory Visit: Payer: 59 | Admitting: Physical Therapy

## 2015-04-14 DIAGNOSIS — M5440 Lumbago with sciatica, unspecified side: Secondary | ICD-10-CM

## 2015-04-14 DIAGNOSIS — M25551 Pain in right hip: Secondary | ICD-10-CM

## 2015-04-14 NOTE — Therapy (Signed)
[place] MAIN [facility] SERVICES [address] [place], [place], [address] Phone: [phone]   Fax:  [phone]  April 14, 2015     Physical Therapy Discharge Summary  Patient: Carolyn Barnes  MRN: [id number]  Date of Birth: 1986/06/25   Diagnosis: Midline low back pain with sciatica, sciatica laterality unspecified  Hip pain, right Referring Provider:  Gennie Alma, CNM  The above patient had been seen in Physical Therapy 3 times of 7 treatments scheduled with 0 no shows and 1 cancellations.  The treatment consisted of exercise, patient education including body mechanics  She called and cancelled her remaining appointments, as she is currently on bed rest while in 3rd trimester of pregnancy. Unable to obtain objective measures at this time.  Thank you for this referral.   Sincerely,   Hopkins,Margaret, PT, DPT     [place] [address] [place], [place], [address] Phone: [phone]   Fax:  [phone]

## 2015-04-15 ENCOUNTER — Other Ambulatory Visit: Payer: Self-pay | Admitting: Obstetrics and Gynecology

## 2015-04-15 ENCOUNTER — Other Ambulatory Visit: Payer: 59

## 2015-04-15 ENCOUNTER — Other Ambulatory Visit: Payer: Self-pay

## 2015-04-15 ENCOUNTER — Other Ambulatory Visit: Payer: Self-pay | Admitting: *Deleted

## 2015-04-15 DIAGNOSIS — O4703 False labor before 37 completed weeks of gestation, third trimester: Secondary | ICD-10-CM

## 2015-04-15 LAB — CULTURE, BETA STREP (GROUP B ONLY): STREP GP B CULTURE: NEGATIVE

## 2015-04-16 ENCOUNTER — Encounter: Payer: Self-pay | Admitting: Obstetrics and Gynecology

## 2015-04-16 ENCOUNTER — Ambulatory Visit (INDEPENDENT_AMBULATORY_CARE_PROVIDER_SITE_OTHER): Payer: 59 | Admitting: Obstetrics and Gynecology

## 2015-04-16 VITALS — BP 119/68 | HR 91 | Wt 164.0 lb

## 2015-04-16 DIAGNOSIS — Z3493 Encounter for supervision of normal pregnancy, unspecified, third trimester: Secondary | ICD-10-CM

## 2015-04-16 LAB — POCT URINALYSIS DIPSTICK
BILIRUBIN UA: NEGATIVE
Blood, UA: NEGATIVE
Glucose, UA: NEGATIVE
KETONES UA: NEGATIVE
Leukocytes, UA: NEGATIVE
NITRITE UA: NEGATIVE
PH UA: 7.5
PROTEIN UA: NEGATIVE
Spec Grav, UA: <=1.005
UROBILINOGEN UA: 0.2

## 2015-04-16 LAB — FETAL FIBRONECTIN: Fetal Fibronectin: NEGATIVE

## 2015-04-16 NOTE — Progress Notes (Signed)
Pt is here for ob work in, c/o R pelvic pressure, woke her up @ 3:30 am and she couldn't go back to sleep

## 2015-04-16 NOTE — Progress Notes (Signed)
Irregular contractions noted on monitor, denies pain at this moment, just lots of pressure- to remain OOW until delivery.PTL s/s and precautions reiterated and to remain on modified bedrest.

## 2015-04-18 ENCOUNTER — Encounter: Payer: Self-pay | Admitting: Obstetrics and Gynecology

## 2015-04-18 ENCOUNTER — Ambulatory Visit (INDEPENDENT_AMBULATORY_CARE_PROVIDER_SITE_OTHER): Payer: 59 | Admitting: Obstetrics and Gynecology

## 2015-04-18 VITALS — BP 106/71 | HR 86 | Wt 164.3 lb

## 2015-04-18 DIAGNOSIS — Z3493 Encounter for supervision of normal pregnancy, unspecified, third trimester: Secondary | ICD-10-CM

## 2015-04-18 LAB — POCT URINALYSIS DIPSTICK
BILIRUBIN UA: NEGATIVE
Blood, UA: NEGATIVE
GLUCOSE UA: NEGATIVE
KETONES UA: NEGATIVE
Leukocytes, UA: NEGATIVE
Nitrite, UA: NEGATIVE
Protein, UA: NEGATIVE
Spec Grav, UA: <=1.005
Urobilinogen, UA: 0.2
pH, UA: 6.5

## 2015-04-18 NOTE — Progress Notes (Signed)
Pt denies any new complaints, states she feels good

## 2015-04-18 NOTE — Progress Notes (Signed)
ROB & cervix check- to continue bedrest, will do growth scan next week, PTL precautions reiterated.

## 2015-04-21 ENCOUNTER — Encounter: Payer: 59 | Admitting: Physical Therapy

## 2015-04-25 ENCOUNTER — Ambulatory Visit (INDEPENDENT_AMBULATORY_CARE_PROVIDER_SITE_OTHER): Payer: 59 | Admitting: Obstetrics and Gynecology

## 2015-04-25 ENCOUNTER — Encounter: Payer: Self-pay | Admitting: Obstetrics and Gynecology

## 2015-04-25 ENCOUNTER — Ambulatory Visit: Payer: 59

## 2015-04-25 VITALS — BP 110/73 | HR 84 | Wt 164.1 lb

## 2015-04-25 DIAGNOSIS — Z3493 Encounter for supervision of normal pregnancy, unspecified, third trimester: Secondary | ICD-10-CM

## 2015-04-25 LAB — POCT URINALYSIS DIPSTICK
Bilirubin, UA: NEGATIVE
Blood, UA: NEGATIVE
Glucose, UA: NEGATIVE
Ketones, UA: NEGATIVE
Leukocytes, UA: NEGATIVE
NITRITE UA: NEGATIVE
Protein, UA: NEGATIVE
Spec Grav, UA: 1.01
UROBILINOGEN UA: 0.2
pH, UA: 6

## 2015-04-25 NOTE — Progress Notes (Signed)
ROB and u/s today:  findings: Indications:EFW and AFI Findings:  Singleton intrauterine pregnancy is visualized with FHR at 163 BPM. Biometrics give an (U/S) Gestational age of 70 5/7 weeks and an (U/S) EDD of 05-25-15; this correlates with the clinically established EDD of 05-30-15.  Fetal presentation is Vertex.  EFW: 2639 grams; 5 lb 13 oz = 55%. Placenta: Anterior; Grade 2 and some 3. AFI: 8.8 cm.   Impression: 1. 35 5/7 week Viable Singleton Intrauterine pregnancy by U/S. 2. (U/S) EDD is consistent with Clinically established (LMP) EDD of 05-25-15. 3. EFW = 2639 grams - 5 lbs 13 oz - 55 % 4. AFI = 8.8 cm.   Reviewed scan with patient and spouse. To continue modified bedrest at this time.  To increase water intake.

## 2015-04-25 NOTE — Progress Notes (Signed)
ROB-some low pelvic pressure today

## 2015-05-02 ENCOUNTER — Encounter: Payer: 59 | Admitting: Obstetrics and Gynecology

## 2015-05-02 ENCOUNTER — Encounter: Payer: Self-pay | Admitting: Obstetrics and Gynecology

## 2015-05-02 ENCOUNTER — Ambulatory Visit (INDEPENDENT_AMBULATORY_CARE_PROVIDER_SITE_OTHER): Payer: 59 | Admitting: Obstetrics and Gynecology

## 2015-05-02 VITALS — BP 117/77 | HR 7 | Wt 165.6 lb

## 2015-05-02 DIAGNOSIS — Z3493 Encounter for supervision of normal pregnancy, unspecified, third trimester: Secondary | ICD-10-CM

## 2015-05-02 LAB — POCT URINALYSIS DIPSTICK
BILIRUBIN UA: NEGATIVE
GLUCOSE UA: NEGATIVE
KETONES UA: NEGATIVE
LEUKOCYTES UA: NEGATIVE
NITRITE UA: NEGATIVE
Protein, UA: NEGATIVE
Spec Grav, UA: 1.01
Urobilinogen, UA: 0.2
pH, UA: 5

## 2015-05-02 NOTE — Progress Notes (Signed)
ROB-slight cramping x 1 05/01/2015, pelvic pressure

## 2015-05-02 NOTE — Progress Notes (Signed)
ROB- doing well, no changes, will lift bedrest next week if no changes occur.

## 2015-05-09 ENCOUNTER — Other Ambulatory Visit: Payer: Self-pay

## 2015-05-09 ENCOUNTER — Encounter: Payer: Self-pay | Admitting: Obstetrics and Gynecology

## 2015-05-09 ENCOUNTER — Other Ambulatory Visit: Payer: Self-pay | Admitting: *Deleted

## 2015-05-09 ENCOUNTER — Other Ambulatory Visit: Payer: Self-pay | Admitting: Obstetrics and Gynecology

## 2015-05-09 ENCOUNTER — Ambulatory Visit (INDEPENDENT_AMBULATORY_CARE_PROVIDER_SITE_OTHER): Payer: 59 | Admitting: Obstetrics and Gynecology

## 2015-05-09 VITALS — BP 126/73 | HR 79 | Wt 168.6 lb

## 2015-05-09 DIAGNOSIS — Z3685 Encounter for antenatal screening for Streptococcus B: Secondary | ICD-10-CM

## 2015-05-09 DIAGNOSIS — Z331 Pregnant state, incidental: Secondary | ICD-10-CM

## 2015-05-09 LAB — POCT URINALYSIS DIPSTICK
Bilirubin, UA: NEGATIVE
Glucose, UA: NEGATIVE
Ketones, UA: NEGATIVE
Leukocytes, UA: NEGATIVE
NITRITE UA: NEGATIVE
PH UA: 6
PROTEIN UA: NEGATIVE
RBC UA: NEGATIVE
UROBILINOGEN UA: 0.2

## 2015-05-09 NOTE — Progress Notes (Signed)
ROB- cultures re-obtained since it has been 4 weeks, bedrest/pelvic rest lifted, may return to work.

## 2015-05-09 NOTE — Progress Notes (Signed)
ROB-pt is having some increased pelvic pressure, some increased swelling, concerned about her wt gain

## 2015-05-11 LAB — STREP GP B NAA: Strep Gp B NAA: NEGATIVE

## 2015-05-14 ENCOUNTER — Ambulatory Visit (INDEPENDENT_AMBULATORY_CARE_PROVIDER_SITE_OTHER): Payer: 59 | Admitting: Obstetrics and Gynecology

## 2015-05-14 ENCOUNTER — Encounter: Payer: Self-pay | Admitting: Obstetrics and Gynecology

## 2015-05-14 VITALS — BP 115/78 | HR 88 | Wt 170.0 lb

## 2015-05-14 DIAGNOSIS — Z331 Pregnant state, incidental: Secondary | ICD-10-CM

## 2015-05-14 LAB — POCT URINALYSIS DIPSTICK
BILIRUBIN UA: NEGATIVE
Blood, UA: NEGATIVE
GLUCOSE UA: NEGATIVE
KETONES UA: NEGATIVE
Leukocytes, UA: NEGATIVE
Nitrite, UA: NEGATIVE
Protein, UA: NEGATIVE
SPEC GRAV UA: 1.01
Urobilinogen, UA: 0.2
pH, UA: 6

## 2015-05-14 NOTE — Progress Notes (Signed)
ROB- doing well, labor precautions reiterated.

## 2015-05-14 NOTE — Progress Notes (Signed)
ROB- pt denies any new complaints

## 2015-05-20 ENCOUNTER — Other Ambulatory Visit: Payer: Self-pay | Admitting: Obstetrics and Gynecology

## 2015-05-20 ENCOUNTER — Other Ambulatory Visit: Payer: Self-pay | Admitting: *Deleted

## 2015-05-20 ENCOUNTER — Other Ambulatory Visit: Payer: 59

## 2015-05-20 DIAGNOSIS — R300 Dysuria: Secondary | ICD-10-CM

## 2015-05-21 LAB — URINE CULTURE

## 2015-05-23 ENCOUNTER — Encounter: Payer: Self-pay | Admitting: Obstetrics and Gynecology

## 2015-05-23 ENCOUNTER — Ambulatory Visit (INDEPENDENT_AMBULATORY_CARE_PROVIDER_SITE_OTHER): Payer: 59 | Admitting: Obstetrics and Gynecology

## 2015-05-23 VITALS — BP 130/84 | HR 88 | Wt 170.8 lb

## 2015-05-23 DIAGNOSIS — Z3493 Encounter for supervision of normal pregnancy, unspecified, third trimester: Secondary | ICD-10-CM | POA: Diagnosis not present

## 2015-05-23 MED ORDER — INFLUENZA VAC SPLIT QUAD 0.5 ML IM SUSY
0.5000 mL | PREFILLED_SYRINGE | Freq: Once | INTRAMUSCULAR | Status: AC
  Administered 2015-05-23: 0.5 mL via INTRAMUSCULAR

## 2015-05-23 NOTE — Progress Notes (Signed)
ROB- some increased swelling, tingling in her legs, pelvic pressure, some contractions

## 2015-05-23 NOTE — Progress Notes (Signed)
ROB- labor precautions reiterated

## 2015-05-29 ENCOUNTER — Ambulatory Visit (INDEPENDENT_AMBULATORY_CARE_PROVIDER_SITE_OTHER): Payer: 59 | Admitting: Obstetrics and Gynecology

## 2015-05-29 ENCOUNTER — Encounter: Payer: Self-pay | Admitting: Obstetrics and Gynecology

## 2015-05-29 ENCOUNTER — Inpatient Hospital Stay
Admission: EM | Admit: 2015-05-29 | Discharge: 2015-05-31 | DRG: 775 | Disposition: A | Discharge status: Home / Self Care | Payer: 59 | Attending: Obstetrics and Gynecology | Admitting: Obstetrics and Gynecology

## 2015-05-29 VITALS — BP 136/89 | HR 80 | Wt 173.3 lb

## 2015-05-29 DIAGNOSIS — Z3A39 39 weeks gestation of pregnancy: Secondary | ICD-10-CM | POA: Diagnosis not present

## 2015-05-29 DIAGNOSIS — Z3493 Encounter for supervision of normal pregnancy, unspecified, third trimester: Secondary | ICD-10-CM

## 2015-05-29 DIAGNOSIS — Z3483 Encounter for supervision of other normal pregnancy, third trimester: Secondary | ICD-10-CM | POA: Diagnosis present

## 2015-05-29 LAB — CBC
HEMATOCRIT: 34.5 % — AB (ref 35.0–47.0)
HEMOGLOBIN: 11 g/dL — AB (ref 12.0–16.0)
MCH: 29.9 pg (ref 26.0–34.0)
MCHC: 31.9 g/dL — AB (ref 32.0–36.0)
MCV: 93.5 fL (ref 80.0–100.0)
Platelets: 189 10*3/uL (ref 150–440)
RBC: 3.69 MIL/uL — ABNORMAL LOW (ref 3.80–5.20)
RDW: 13.8 % (ref 11.5–14.5)
WBC: 15.3 10*3/uL — ABNORMAL HIGH (ref 3.6–11.0)

## 2015-05-29 LAB — POCT URINALYSIS DIPSTICK
BILIRUBIN UA: NEGATIVE
Blood, UA: NEGATIVE
GLUCOSE UA: NEGATIVE
KETONES UA: NEGATIVE
LEUKOCYTES UA: NEGATIVE
Nitrite, UA: NEGATIVE
PH UA: 7
Spec Grav, UA: 1.01
Urobilinogen, UA: 0.2

## 2015-05-29 MED ORDER — OXYTOCIN 40 UNITS IN LACTATED RINGERS INFUSION - SIMPLE MED
INTRAVENOUS | Status: AC
  Filled 2015-05-29: qty 1000

## 2015-05-29 MED ORDER — LACTATED RINGERS IV SOLN: 500.0000 mL | INTRAVENOUS | Status: DC | PRN

## 2015-05-29 MED ORDER — AMMONIA AROMATIC IN INHA
RESPIRATORY_TRACT | Status: AC
  Filled 2015-05-29: qty 10

## 2015-05-29 MED ORDER — LIDOCAINE HCL (PF) 1 % IJ SOLN
INTRAMUSCULAR | Status: AC
  Filled 2015-05-29: qty 30

## 2015-05-29 MED ORDER — BUTORPHANOL TARTRATE 1 MG/ML IJ SOLN: 2.0000 mg | INTRAMUSCULAR | Status: DC | PRN

## 2015-05-29 MED ORDER — FENTANYL CITRATE (PF) 100 MCG/2ML IJ SOLN: 50.0000 ug | INTRAMUSCULAR | Status: DC | PRN

## 2015-05-29 MED ORDER — OXYTOCIN 40 UNITS IN LACTATED RINGERS INFUSION - SIMPLE MED
62.5000 mL/h | INTRAVENOUS | Status: DC
Start: 2015-05-29 — End: 2015-05-30

## 2015-05-29 MED ORDER — ONDANSETRON HCL 4 MG/2ML IJ SOLN: 4.0000 mg | Freq: Four times a day (QID) | INTRAMUSCULAR | Status: DC | PRN

## 2015-05-29 MED ORDER — OXYTOCIN BOLUS FROM INFUSION
500.0000 mL | INTRAVENOUS | Status: DC
Start: 2015-05-29 — End: 2015-05-30

## 2015-05-29 MED ORDER — LIDOCAINE HCL (PF) 1 % IJ SOLN
30.0000 mL | INTRAMUSCULAR | Status: DC | PRN
  Filled 2015-05-29: qty 30

## 2015-05-29 MED ORDER — ACETAMINOPHEN 325 MG PO TABS: 650.0000 mg | ORAL_TABLET | ORAL | Status: DC | PRN

## 2015-05-29 MED ORDER — LACTATED RINGERS IV SOLN
INTRAVENOUS | Status: DC
Start: 2015-05-29 — End: 2015-05-30

## 2015-05-29 MED ORDER — CITRIC ACID-SODIUM CITRATE 334-500 MG/5ML PO SOLN: 30.0000 mL | ORAL | Status: DC | PRN

## 2015-05-29 MED ORDER — MISOPROSTOL 200 MCG PO TABS
ORAL_TABLET | ORAL | Status: AC
Start: 2015-05-29 — End: 2015-05-30
  Filled 2015-05-29: qty 4

## 2015-05-29 MED ORDER — OXYTOCIN 10 UNIT/ML IJ SOLN
INTRAMUSCULAR | Status: AC
Start: 2015-05-29 — End: 2015-05-30
  Filled 2015-05-29: qty 2

## 2015-05-29 NOTE — H&P (Signed)
Obstetric History and Physical  Carolyn Barnes is a 29 y.o. [phone] with IUP at [redacted]w[redacted]d presenting with contractions stronger since 7pm. Patient states she has been having  regular, every 3 minutes contractions, minimal vaginal bleeding, intact membranes, with active fetal movement.    Prenatal Course Source of Care: [facility]  Pregnancy complications or risks:short stature  Prenatal labs and studies: ABO, Rh: O/Positive/-- (02/23 0000) Antibody: Negative (02/23 0000) Rubella: Immune (02/23 0000) RPR: Nonreactive (02/23 0000)  HBsAg: Negative (02/23 0000)  HIV: Non-reactive (02/23 0000)  GBS: negative 1 hr Glucola  normal Genetic screening normal Anatomy US normal  Past Medical History  Diagnosis Date  . Migraine headache   . Seasonal allergies   . Anxiety   . Fatigue   . Indigestion   . Vitamin D deficiency disease     Past Surgical History  Procedure Laterality Date  . Dilation and curettage of uterus  02/2013,10/2013    OB History  Gravida Para Term Preterm AB SAB TAB Ectopic Multiple Living  4 1 1  2 2    1     # Outcome Date GA Lbr Len/2nd Weight Sex Delivery Anes PTL Lv  4 Current           3 SAB 2015     SAB   FD  2 SAB 2014     SAB   FD  1 Term 2010 [redacted]w[redacted]d  2.778 kg (6 lb 2 oz) F Vag-Spont  N Y      Social History   Social History  . Marital Status: Married    Spouse Name: N/A  . Number of Children: N/A  . Years of Education: N/A   Social History Main Topics  . Smoking status: Never Smoker   . Smokeless tobacco: Never Used  . Alcohol Use: No  . Drug Use: No  . Sexual Activity: Yes    Birth Control/ Protection: None   Other Topics Concern  . Not on file   Social History Narrative    Family History  Problem Relation Age of Onset  . Heart disease Maternal Grandmother   . Diabetes Maternal Grandmother     Prescriptions prior to admission  Medication Sig Dispense Refill Last Dose  . docusate sodium (COLACE) 100 MG capsule Take 100 mg by mouth 2  (two) times daily.   Taking  . lansoprazole (PREVACID) 30 MG capsule Take 30 mg by mouth daily at 12 noon.   Taking  . pantoprazole (PROTONIX) 40 MG tablet Take 40 mg by mouth daily.   Not Taking  . Prenat-FeAsp-Meth-FA-DHA w/o A (PRENATE PIXIE) 10-0.6-0.4-200 MG CAPS Take by mouth.   Taking    No Known Allergies  Review of Systems: Negative except for what is mentioned in HPI.  Physical Exam: Resp 22  Wt 78.472 kg (173 lb)  LMP 08/23/2014 (LMP Unknown) GENERAL: Well-developed, well-nourished female in no acute distress.  LUNGS: Clear to auscultation bilaterally.  HEART: Regular rate and rhythm. ABDOMEN: Soft, nontender, nondistended, gravid. EXTREMITIES: Nontender, no edema, 2+ distal pulses. Cervical Exam: Dilation: 5.5 Effacement (%): 90 Station: -2 FHT:  Baseline rate 155 bpm   Variability moderate  Accelerations present   Decelerations variable Contractions: Every 2-3 mins, moderate to palpation   Pertinent Labs/Studies:   Results for orders placed or performed in visit on 05/29/15 (from the past 24 hour(s))  POCT urinalysis dipstick     Status: None   Collection Time: 05/29/15  1:35 PM  Result Value Ref Range  Color, UA yellow    Clarity, UA clear    Glucose, UA neg    Bilirubin, UA neg    Ketones, UA neg    Spec Grav, UA 1.010    Blood, UA neg    pH, UA 7.0    Protein, UA trace    Urobilinogen, UA 0.2    Nitrite, UA neg    Leukocytes, UA Negative Negative    Assessment : Carolyn Barnes is a 29 y.o. [id number] at [redacted]w[redacted]d being admitted for labor.  Plan: Labor: Expectant management.  Induction/Augmentation as needed, per protocol; hydrotherapy as planned FWB: Reassuring fetal heart tracing.  GBS negative Delivery plan: Hopeful for vaginal delivery  Melody Trudee Kuster, CNM [facility], [facility]

## 2015-05-29 NOTE — Progress Notes (Signed)
Carolyn Barnes is a 29 y.o. [phone] at [redacted]w[redacted]d by LMP admitted for active labor  Subjective:   Objective: Resp 22  Wt 78.472 kg (173 lb)  LMP 08/23/2014 (LMP Unknown)      Fetal Wellbeing:  Category I UC:   regular, every 3-4 minutes SVE:   9/90/-1-AROM Clear fluid Labs: Lab Results  Component Value Date   WBC 7.1 02/19/2013   HGB 12.0 11/05/2014   HCT 31.9* 03/07/2015   MCV 97 02/19/2013   PLT 242 11/05/2014    Assessment / Plan: Spontaneous labor, progressing normally  Labor: Progressing normally Preeclampsia:  NA Pain Control:  Labor support without medications Anticipated MOD:  NSVD  Melody Burr 05/29/2015, 10:30 PM

## 2015-05-29 NOTE — Progress Notes (Signed)
Carolyn Barnes is a 29 y.o. [phone] at [redacted]w[redacted]d by LMP admitted for active labor  Subjective:   Objective: Resp 22  Wt 78.472 kg (173 lb)  LMP 08/23/2014 (LMP Unknown)      Fetal Wellbeing:  Category I UC:   regular, every 3 minutes SVE:   9.5/100/-1 Labs: Lab Results  Component Value Date   WBC 7.1 02/19/2013   HGB 12.0 11/05/2014   HCT 31.9* 03/07/2015   MCV 97 02/19/2013   PLT 242 11/05/2014    Assessment / Plan: Spontaneous labor, progressing normally  Labor: Progressing normally Preeclampsia:  NA Pain Control:  Labor support without medications Anticipated MOD:  NSVD  Melody Burr 05/29/2015, 10:45 PM

## 2015-05-29 NOTE — Progress Notes (Signed)
ROB- will go in tomorrow for AROM IOL at [phone]

## 2015-05-29 NOTE — Progress Notes (Signed)
ROB-pt is ready

## 2015-05-30 DIAGNOSIS — Z3493 Encounter for supervision of normal pregnancy, unspecified, third trimester: Secondary | ICD-10-CM

## 2015-05-30 LAB — CBC
HEMATOCRIT: 31.7 % — AB (ref 35.0–47.0)
HEMOGLOBIN: 10.3 g/dL — AB (ref 12.0–16.0)
MCH: 30.3 pg (ref 26.0–34.0)
MCHC: 32.5 g/dL (ref 32.0–36.0)
MCV: 93.4 fL (ref 80.0–100.0)
Platelets: 171 10*3/uL (ref 150–440)
RBC: 3.39 MIL/uL — ABNORMAL LOW (ref 3.80–5.20)
RDW: 13.7 % (ref 11.5–14.5)
WBC: 16 10*3/uL — AB (ref 3.6–11.0)

## 2015-05-30 MED ORDER — IBUPROFEN 600 MG PO TABS
ORAL_TABLET | ORAL | Status: AC
  Administered 2015-05-30: 600 mg via ORAL
  Filled 2015-05-30: qty 1

## 2015-05-30 MED ORDER — DIPHENHYDRAMINE HCL 25 MG PO CAPS: 25.0000 mg | ORAL_CAPSULE | Freq: Four times a day (QID) | ORAL | Status: DC | PRN

## 2015-05-30 MED ORDER — ONDANSETRON HCL 4 MG PO TABS: 4.0000 mg | ORAL_TABLET | ORAL | Status: DC | PRN

## 2015-05-30 MED ORDER — BENZOCAINE-MENTHOL 20-0.5 % EX AERO: 1.0000 "application " | INHALATION_SPRAY | CUTANEOUS | Status: DC | PRN

## 2015-05-30 MED ORDER — ONDANSETRON HCL 4 MG/2ML IJ SOLN: 4.0000 mg | INTRAMUSCULAR | Status: DC | PRN

## 2015-05-30 MED ORDER — LANOLIN HYDROUS EX OINT: TOPICAL_OINTMENT | CUTANEOUS | Status: DC | PRN

## 2015-05-30 MED ORDER — ACETAMINOPHEN 325 MG PO TABS
650.0000 mg | ORAL_TABLET | ORAL | Status: DC | PRN
  Administered 2015-05-30 – 2015-05-31 (×6): 650 mg via ORAL
  Filled 2015-05-30 (×6): qty 2

## 2015-05-30 MED ORDER — SENNOSIDES-DOCUSATE SODIUM 8.6-50 MG PO TABS: 2.0000 | ORAL_TABLET | ORAL | Status: DC

## 2015-05-30 MED ORDER — SIMETHICONE 80 MG PO CHEW: 80.0000 mg | CHEWABLE_TABLET | ORAL | Status: DC | PRN

## 2015-05-30 MED ORDER — OXYCODONE-ACETAMINOPHEN 5-325 MG PO TABS: 2.0000 | ORAL_TABLET | ORAL | Status: DC | PRN

## 2015-05-30 MED ORDER — DIBUCAINE 1 % RE OINT: 1.0000 "application " | TOPICAL_OINTMENT | RECTAL | Status: DC | PRN

## 2015-05-30 MED ORDER — PRENATAL MULTIVITAMIN CH
1.0000 | ORAL_TABLET | Freq: Every day | ORAL | Status: DC
  Administered 2015-05-30 – 2015-05-31 (×2): 1 via ORAL
  Filled 2015-05-30 (×2): qty 1

## 2015-05-30 MED ORDER — DOCUSATE SODIUM 100 MG PO CAPS
100.0000 mg | ORAL_CAPSULE | Freq: Two times a day (BID) | ORAL | Status: DC
  Administered 2015-05-30 – 2015-05-31 (×3): 100 mg via ORAL
  Filled 2015-05-30 (×3): qty 1

## 2015-05-30 MED ORDER — IBUPROFEN 600 MG PO TABS
600.0000 mg | ORAL_TABLET | Freq: Four times a day (QID) | ORAL | Status: DC
  Administered 2015-05-30 – 2015-05-31 (×6): 600 mg via ORAL
  Filled 2015-05-30 (×6): qty 1

## 2015-05-30 MED ORDER — WITCH HAZEL-GLYCERIN EX PADS: 1.0000 "application " | MEDICATED_PAD | CUTANEOUS | Status: DC | PRN

## 2015-05-30 MED ORDER — FERROUS SULFATE 325 (65 FE) MG PO TABS
325.0000 mg | ORAL_TABLET | Freq: Two times a day (BID) | ORAL | Status: DC
  Administered 2015-05-30 – 2015-05-31 (×3): 325 mg via ORAL
  Filled 2015-05-30 (×3): qty 1

## 2015-05-30 MED ORDER — OXYCODONE-ACETAMINOPHEN 5-325 MG PO TABS: 1.0000 | ORAL_TABLET | ORAL | Status: DC | PRN

## 2015-05-31 LAB — RPR: RPR: NONREACTIVE

## 2015-05-31 NOTE — Discharge Instructions (Signed)
Call your doctor for increased pain or vaginal bleeding, temperature above 100.4, depression, or concerns.  No strenuous activity or heavy lifting for 6 weeks.  No intercourse, tampons, douching, or enemas for 6 weeks.  No tub baths-showers only.  No driving for 2 weeks or while taking pain medications.  Continue prenatal vitamin and iron.  Increase calories and fluids while breastfeeding.

## 2015-05-31 NOTE — Progress Notes (Signed)
Discharge instructions provided.  Pt verbalizes understanding of all instructions and follow-up care.  Pt discharged to home with infant at 1250 on 05/31/15 via wheelchair by CNA. Reed Breech, RN 05/31/2015 7:45 PM

## 2015-05-31 NOTE — Discharge Summary (Signed)
Obstetric Discharge Summary Reason for Admission: onset of labor Prenatal Procedures: NST and ultrasound Intrapartum Procedures: spontaneous vaginal delivery Postpartum Procedures: none Complications-Operative and Postpartum: none HEMOGLOBIN  Date Value Ref Range Status  05/30/2015 10.3* 12.0 - 16.0 g/dL Final  11/05/2014 12.0 g/dL Final   HGB  Date Value Ref Range Status  11/02/2013 12.4 12.0-16.0 g/dL Final   HCT  Date Value Ref Range Status  05/30/2015 31.7* 35.0 - 47.0 % Final  11/05/2014 36 % Final  02/19/2013 37.2 35.0-47.0 % Final   HEMATOCRIT  Date Value Ref Range Status  03/07/2015 31.9* 34.0 - 46.6 % Final    Physical Exam:  General: alert, cooperative and appears stated age 29: appropriate Uterine Fundus: firm Incision: NA DVT Evaluation: No evidence of DVT seen on physical exam.  Discharge Diagnoses: Term Pregnancy-delivered  Discharge Information: Date: 05/31/2015 Activity: pelvic rest Diet: routine Medications: PNV, Ibuprofen, Colace and Iron Condition: stable Instructions: refer to practice specific booklet Discharge to: home   Newborn Data: Live born female  Birth Weight: 7 lb 8.3 oz (3410 g) APGAR: 8, 9  Home with mother.  Carolyn Barnes 05/31/2015, 9:41 AM

## 2015-05-31 NOTE — Progress Notes (Signed)
Pt states that she received TDaP and Influenza vaccines during pregnancy.  Pt declines vaccines at this time. Reed Breech, RN 05/31/2015 10:25 AM

## 2015-07-11 ENCOUNTER — Ambulatory Visit (INDEPENDENT_AMBULATORY_CARE_PROVIDER_SITE_OTHER): Payer: 59 | Admitting: Obstetrics and Gynecology

## 2015-07-11 ENCOUNTER — Encounter: Payer: Self-pay | Admitting: Obstetrics and Gynecology

## 2015-07-11 NOTE — Progress Notes (Signed)
  Subjective:     Carolyn Barnes is a 29 y.o. female who presents for a postpartum visit. She is 6 weeks postpartum following a spontaneous vaginal delivery. I have fully reviewed the prenatal and intrapartum course. The delivery was at 84 gestational weeks. Outcome: spontaneous vaginal delivery. Anesthesia: none. Postpartum course has been uneventful. Baby's course has been uneventful. Baby is feeding by breast. Bleeding no bleeding. Bowel function is constipation and pain with hemorrhoids. Bladder function is normal. Patient is not sexually active. Contraception method is vasectomy. Postpartum depression screening: negative.  The following portions of the patient's history were reviewed and updated as appropriate: allergies, current medications, past family history, past medical history, past social history, past surgical history and problem list.  Review of Systems A comprehensive review of systems was negative.   Objective:    BP 132/101 mmHg  Pulse 88  Ht 4\' 9"  (1.448 m)  Wt 140 lb 14.4 oz (63.912 kg)  BMI 30.48 kg/m2  LMP 08/23/2014 (LMP Unknown)  Breastfeeding? Unknown  General:  alert, cooperative and appears stated age   Breasts:  inspection negative, no nipple discharge or bleeding, no masses or nodularity palpable  Lungs: clear to auscultation bilaterally  Heart:  regular rate and rhythm, S1, S2 normal, no murmur, click, rub or gallop  Abdomen: soft, non-tender; bowel sounds normal; no masses,  no organomegaly   Vulva:  normal  Vagina: normal vagina, no discharge, exudate, lesion, or erythema  Cervix:  multiparous appearance  Corpus: normal size, contour, position, consistency, mobility, non-tender  Adnexa:  normal adnexa  Rectal Exam: Not performed.        Assessment:     6 weeks postpartum exam. Pap smear not done at today's visit.   Plan:    1. Contraception: vasectomy 2. breastfeeding 3. Follow up in: 4 months or as needed.

## 2015-07-17 ENCOUNTER — Telehealth: Payer: Self-pay | Admitting: *Deleted

## 2015-07-17 ENCOUNTER — Other Ambulatory Visit: Payer: Self-pay | Admitting: Obstetrics and Gynecology

## 2015-07-17 DIAGNOSIS — R300 Dysuria: Secondary | ICD-10-CM

## 2015-07-17 NOTE — Telephone Encounter (Signed)
Ordered pt ua culture

## 2015-07-18 ENCOUNTER — Other Ambulatory Visit: Payer: Self-pay

## 2015-07-18 DIAGNOSIS — R300 Dysuria: Secondary | ICD-10-CM

## 2015-07-18 LAB — CBC
HEMATOCRIT: 37.7 % (ref 34.0–46.6)
HEMOGLOBIN: 13.2 g/dL (ref 11.1–15.9)
MCH: 30.6 pg (ref 26.6–33.0)
MCHC: 35 g/dL (ref 31.5–35.7)
MCV: 87 fL (ref 79–97)
Platelets: 262 10*3/uL (ref 150–379)
RBC: 4.32 x10E6/uL (ref 3.77–5.28)
RDW: 13.9 % (ref 12.3–15.4)
WBC: 6.3 10*3/uL (ref 3.4–10.8)

## 2015-07-18 LAB — COMPREHENSIVE METABOLIC PANEL
ALK PHOS: 84 IU/L (ref 39–117)
ALT: 25 IU/L (ref 0–32)
AST: 18 IU/L (ref 0–40)
Albumin/Globulin Ratio: 1.7 (ref 1.1–2.5)
Albumin: 4.5 g/dL (ref 3.5–5.5)
BUN/Creatinine Ratio: 8 (ref 8–20)
BUN: 9 mg/dL (ref 6–20)
CHLORIDE: 102 mmol/L (ref 97–106)
CO2: 26 mmol/L (ref 18–29)
CREATININE: 1.06 mg/dL — AB (ref 0.57–1.00)
Calcium: 9 mg/dL (ref 8.7–10.2)
GFR calc Af Amer: 83 mL/min/{1.73_m2} (ref >59)
GFR calc non Af Amer: 72 mL/min/{1.73_m2} (ref >59)
GLUCOSE: 83 mg/dL (ref 65–99)
Globulin, Total: 2.7 g/dL (ref 1.5–4.5)
Potassium: 3.8 mmol/L (ref 3.5–5.2)
Sodium: 142 mmol/L (ref 136–144)
TOTAL PROTEIN: 7.2 g/dL (ref 6.0–8.5)

## 2015-07-18 LAB — MICROALBUMIN / CREATININE URINE RATIO
Creatinine, Urine: 109.7 mg/dL
MICROALB/CREAT RATIO: 6.7 mg/g{creat} (ref 0.0–30.0)
MICROALBUM., U, RANDOM: 7.4 ug/mL

## 2015-07-18 LAB — URIC ACID: URIC ACID: 5.4 mg/dL (ref 2.5–7.1)

## 2015-07-18 NOTE — Telephone Encounter (Signed)
Done

## 2015-07-19 LAB — URINE CULTURE: Organism ID, Bacteria: NO GROWTH

## 2015-07-31 ENCOUNTER — Telehealth: Payer: Self-pay | Admitting: *Deleted

## 2015-07-31 ENCOUNTER — Other Ambulatory Visit: Payer: Self-pay | Admitting: Obstetrics and Gynecology

## 2015-07-31 DIAGNOSIS — O99345 Other mental disorders complicating the puerperium: Principal | ICD-10-CM

## 2015-07-31 DIAGNOSIS — F53 Postpartum depression: Secondary | ICD-10-CM

## 2015-07-31 MED ORDER — SERTRALINE HCL 50 MG PO TABS: 50.0000 mg | ORAL_TABLET | Freq: Every day | ORAL | Status: DC

## 2015-07-31 NOTE — Telephone Encounter (Signed)
Pt is wanting something for anxiety and depression, she has lexapro @ home, however her therapist recommends Zoloft??? pls advise

## 2015-07-31 NOTE — Telephone Encounter (Signed)
I sent in rx for zoloft

## 2015-08-15 ENCOUNTER — Other Ambulatory Visit: Payer: Self-pay | Admitting: Internal Medicine

## 2015-09-02 ENCOUNTER — Encounter: Payer: Self-pay | Admitting: Internal Medicine

## 2015-09-02 DIAGNOSIS — K219 Gastro-esophageal reflux disease without esophagitis: Secondary | ICD-10-CM | POA: Insufficient documentation

## 2015-09-02 DIAGNOSIS — G43909 Migraine, unspecified, not intractable, without status migrainosus: Secondary | ICD-10-CM | POA: Insufficient documentation

## 2015-09-02 DIAGNOSIS — J309 Allergic rhinitis, unspecified: Secondary | ICD-10-CM | POA: Insufficient documentation

## 2015-09-17 ENCOUNTER — Other Ambulatory Visit: Payer: Self-pay | Admitting: Obstetrics and Gynecology

## 2015-09-17 MED ORDER — METOCLOPRAMIDE HCL 10 MG PO TABS
10.0000 mg | ORAL_TABLET | Freq: Four times a day (QID) | ORAL | Status: DC | PRN
Start: 2015-09-17 — End: 2015-12-03

## 2015-10-01 ENCOUNTER — Ambulatory Visit: Payer: Self-pay | Admitting: Physician Assistant

## 2015-10-01 ENCOUNTER — Encounter: Payer: Self-pay | Admitting: Physician Assistant

## 2015-10-01 VITALS — BP 110/80 | HR 64 | Temp 98.0°F

## 2015-10-01 DIAGNOSIS — S161XXA Strain of muscle, fascia and tendon at neck level, initial encounter: Secondary | ICD-10-CM

## 2015-10-01 MED ORDER — MELOXICAM 15 MG PO TABS: 15.0000 mg | ORAL_TABLET | Freq: Every day | ORAL | Status: DC

## 2015-10-01 MED ORDER — CYCLOBENZAPRINE HCL 10 MG PO TABS
10.0000 mg | ORAL_TABLET | Freq: Three times a day (TID) | ORAL | Status: DC | PRN
Start: 2015-10-01 — End: 2015-12-03
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
## 2015-10-03 ENCOUNTER — Ambulatory Visit: Payer: 59 | Admitting: Internal Medicine

## 2015-10-06 ENCOUNTER — Ambulatory Visit: Payer: 59 | Admitting: Internal Medicine

## 2015-10-13 ENCOUNTER — Ambulatory Visit: Payer: 59 | Admitting: Internal Medicine

## 2015-11-10 ENCOUNTER — Encounter: Payer: Self-pay | Admitting: Internal Medicine

## 2015-12-03 ENCOUNTER — Encounter: Payer: Self-pay | Admitting: Obstetrics and Gynecology

## 2015-12-03 ENCOUNTER — Ambulatory Visit (INDEPENDENT_AMBULATORY_CARE_PROVIDER_SITE_OTHER): Payer: 59 | Admitting: Obstetrics and Gynecology

## 2015-12-03 VITALS — BP 112/81 | HR 71 | Ht <= 58 in | Wt 135.0 lb

## 2015-12-03 DIAGNOSIS — N926 Irregular menstruation, unspecified: Secondary | ICD-10-CM | POA: Diagnosis not present

## 2015-12-03 DIAGNOSIS — R5383 Other fatigue: Secondary | ICD-10-CM | POA: Diagnosis not present

## 2015-12-03 DIAGNOSIS — F32A Depression, unspecified: Secondary | ICD-10-CM

## 2015-12-03 DIAGNOSIS — F329 Major depressive disorder, single episode, unspecified: Secondary | ICD-10-CM

## 2015-12-03 MED ORDER — POLYETHYLENE GLYCOL 3350 17 GM/SCOOP PO POWD: 1.0000 | Freq: Once | ORAL | Status: DC

## 2015-12-03 MED ORDER — ESCITALOPRAM OXALATE 10 MG PO TABS: 10.0000 mg | ORAL_TABLET | Freq: Every day | ORAL | Status: DC

## 2015-12-03 NOTE — Progress Notes (Signed)
Patient ID: Carolyn Barnes, female   DOB: 03/19/1986, 30 y.o.   MRN: [id number]  S: reports irregular occuring menses since delivery of infant, occuring every 22-32 days, with heavy and painful flow the first 2 months, and more normal lighter flow since then.  Also notes feeling fatigued and depressed at times. Never started SSRI, instead started exercising and losing weight- felt somewhat better, but not resolved. Not sleeping due to infant, and also other daughters has night terrors and nose bleeds, spouse works third shift.  Last of all reports pain with deep penetration for last few weeks. Denies post coital bleeding.  O: A&O x4  Well groomed female in no distress Blood pressure 112/81, pulse 71, height 4\' 9"  (1.448 m), weight 135 lb (61.236 kg), last menstrual period 11/22/2015, not currently breastfeeding. Blood pressure 112/81, pulse 71, height 4\' 9"  (1.448 m), weight 135 lb (61.236 kg), last menstrual period 11/22/2015, not currently breastfeeding.  A: Irregular menses Depression- postpartum Coital pain  P: labs ordered. Counseled on common causes of pelvic pain and irregular menses- including ovarian cyst; constipation; menstrual changes in cervical length, weight loss. Started Lexapro 10mg  daily, can increase to 20mg  after two weeks if needed.  To watch menses and will consider u/s if pain or irregularity persist.  Lorelle Gibbs, CNM

## 2015-12-04 DIAGNOSIS — R5383 Other fatigue: Secondary | ICD-10-CM | POA: Diagnosis not present

## 2015-12-05 ENCOUNTER — Ambulatory Visit: Payer: 59 | Admitting: Internal Medicine

## 2015-12-05 LAB — COMPREHENSIVE METABOLIC PANEL
A/G RATIO: 1.6 (ref 1.2–2.2)
ALBUMIN: 4.3 g/dL (ref 3.5–5.5)
ALK PHOS: 53 IU/L (ref 39–117)
ALT: 15 IU/L (ref 0–32)
AST: 15 IU/L (ref 0–40)
BILIRUBIN TOTAL: 0.3 mg/dL (ref 0.0–1.2)
BUN / CREAT RATIO: 15 (ref 8–20)
BUN: 11 mg/dL (ref 6–20)
CO2: 21 mmol/L (ref 18–29)
CREATININE: 0.75 mg/dL (ref 0.57–1.00)
Calcium: 9.4 mg/dL (ref 8.7–10.2)
Chloride: 103 mmol/L (ref 96–106)
GFR, EST AFRICAN AMERICAN: 125 mL/min/{1.73_m2} (ref >59)
GFR, EST NON AFRICAN AMERICAN: 109 mL/min/{1.73_m2} (ref >59)
GLUCOSE: 83 mg/dL (ref 65–99)
Globulin, Total: 2.7 g/dL (ref 1.5–4.5)
Potassium: 4.1 mmol/L (ref 3.5–5.2)
Sodium: 139 mmol/L (ref 134–144)
Total Protein: 7 g/dL (ref 6.0–8.5)

## 2015-12-05 LAB — CBC
HEMOGLOBIN: 13.2 g/dL (ref 11.1–15.9)
Hematocrit: 38.1 % (ref 34.0–46.6)
MCH: 31.9 pg (ref 26.6–33.0)
MCHC: 34.6 g/dL (ref 31.5–35.7)
MCV: 92 fL (ref 79–97)
Platelets: 300 10*3/uL (ref 150–379)
RBC: 4.14 x10E6/uL (ref 3.77–5.28)
RDW: 12.7 % (ref 12.3–15.4)
WBC: 4.5 10*3/uL (ref 3.4–10.8)

## 2015-12-05 LAB — IRON: Iron: 86 ug/dL (ref 27–159)

## 2015-12-05 LAB — VITAMIN D 25 HYDROXY (VIT D DEFICIENCY, FRACTURES): Vit D, 25-Hydroxy: 28.5 ng/mL — ABNORMAL LOW (ref 30.0–100.0)

## 2015-12-11 ENCOUNTER — Other Ambulatory Visit: Payer: Self-pay | Admitting: Obstetrics and Gynecology

## 2015-12-11 MED ORDER — OSELTAMIVIR PHOSPHATE 75 MG PO CAPS: 75.0000 mg | ORAL_CAPSULE | Freq: Every day | ORAL | Status: DC

## 2016-01-09 ENCOUNTER — Other Ambulatory Visit: Payer: Self-pay | Admitting: Obstetrics and Gynecology

## 2016-01-09 DIAGNOSIS — R300 Dysuria: Secondary | ICD-10-CM | POA: Diagnosis not present

## 2016-01-10 LAB — URINE CULTURE: Organism ID, Bacteria: NO GROWTH

## 2016-01-12 ENCOUNTER — Other Ambulatory Visit: Payer: Self-pay | Admitting: *Deleted

## 2016-01-12 MED ORDER — FLUCONAZOLE 150 MG PO TABS: 150.0000 mg | ORAL_TABLET | Freq: Once | ORAL | Status: DC

## 2016-02-17 ENCOUNTER — Other Ambulatory Visit: Payer: Self-pay

## 2016-02-17 MED ORDER — ERYTHROMYCIN 5 MG/GM OP OINT: 1.0000 "application " | TOPICAL_OINTMENT | Freq: Three times a day (TID) | OPHTHALMIC | Status: DC

## 2016-02-23 ENCOUNTER — Ambulatory Visit (INDEPENDENT_AMBULATORY_CARE_PROVIDER_SITE_OTHER): Payer: 59 | Admitting: Gastroenterology

## 2016-02-23 ENCOUNTER — Encounter: Payer: Self-pay | Admitting: Gastroenterology

## 2016-02-23 VITALS — BP 121/65 | HR 76 | Temp 98.2°F | Wt 132.0 lb

## 2016-02-23 DIAGNOSIS — K589 Irritable bowel syndrome without diarrhea: Secondary | ICD-10-CM | POA: Diagnosis not present

## 2016-02-23 DIAGNOSIS — K582 Mixed irritable bowel syndrome: Secondary | ICD-10-CM

## 2016-02-23 NOTE — Progress Notes (Signed)
   Primary Care Physician: Halina Maidens, MD  Primary Gastroenterologist:  Dr. Lucilla Lame  Chief Complaint  Patient presents with  . Follow-up    IBS Diarrhea and Constipation    HPI: Carolyn Barnes is a 30 y.o. female here for follow-up of her constipation.  The patient was started on Amitiza and states that it gave her diarrhea.  The patient is now on Colace every day with a single bowel movement once a week.  She states that when she has the bowel movement she has severe abdominal pain with sweating and then completely evacuates her colon and this cycle then repeats itself.  There is no report of any unexplained weight loss but the patient does report that her internal hemorrhoids have been bothering her.  Current Outpatient Prescriptions  Medication Sig Dispense Refill  . Biotin 10 MG CAPS Take 1 tablet by mouth 1 day or 1 dose.    . docusate sodium (COLACE) 100 MG capsule Take 100 mg by mouth 2 (two) times daily.    Marland Kitchen escitalopram (LEXAPRO) 10 MG tablet Take 1 tablet (10 mg total) by mouth daily. 30 tablet 6  . fluticasone (FLONASE) 50 MCG/ACT nasal spray USE 2 SPRAYS IN EACH NOSTRIL DAILY 48 g 0   No current facility-administered medications for this visit.    Allergies as of 02/23/2016  . (No Known Allergies)    ROS:  General: Negative for anorexia, weight loss, fever, chills, fatigue, weakness. ENT: Negative for hoarseness, difficulty swallowing , nasal congestion. CV: Negative for chest pain, angina, palpitations, dyspnea on exertion, peripheral edema.  Respiratory: Negative for dyspnea at rest, dyspnea on exertion, cough, sputum, wheezing.  GI: See history of present illness. GU:  Negative for dysuria, hematuria, urinary incontinence, urinary frequency, nocturnal urination.  Endo: Negative for unusual weight change.    Physical Examination:   BP 121/65 mmHg  Pulse 76  Temp(Src) 98.2 F (36.8 C) (Oral)  Wt 132 lb (59.875 kg)  General: Well-nourished,  well-developed in no acute distress.  Eyes: No icterus. Conjunctivae pink. Mouth: Oropharyngeal mucosa moist and pink , no lesions erythema or exudate. Lungs: Clear to auscultation bilaterally. Non-labored. Heart: Regular rate and rhythm, no murmurs rubs or gallops.  Abdomen: Bowel sounds are normal, nontender, nondistended, no hepatosplenomegaly or masses, no abdominal bruits or hernia , no rebound or guarding.   Extremities: No lower extremity edema. No clubbing or deformities. Neuro: Alert and oriented x 3.  Grossly intact. Skin: Warm and dry, no jaundice.   Psych: Alert and cooperative, normal mood and affect.  Labs:    Imaging Studies: No results found.  Assessment and Plan:   Carolyn Barnes is a 30 y.o. y/o female who comes here with a history of chronic constipation with alternating diarrhea usually after she takes a laxative for a week.  The patient has been told to start Citrucel and she has been put on the lowest dose of Linzess to be taken a half hour before she eats.  The patient will try this and if she does not have improvement she will contact me.   Note: This dictation was prepared with Dragon dictation along with smaller phrase technology. Any transcriptional errors that result from this process are unintentional.

## 2016-05-31 ENCOUNTER — Other Ambulatory Visit: Payer: Self-pay | Admitting: *Deleted

## 2016-05-31 MED ORDER — ESCITALOPRAM OXALATE 10 MG PO TABS: 10.0000 mg | ORAL_TABLET | Freq: Every day | ORAL | 6 refills | Status: DC

## 2016-08-09 DIAGNOSIS — Z131 Encounter for screening for diabetes mellitus: Secondary | ICD-10-CM | POA: Diagnosis not present

## 2016-08-09 DIAGNOSIS — Z13220 Encounter for screening for lipoid disorders: Secondary | ICD-10-CM | POA: Diagnosis not present

## 2016-08-09 DIAGNOSIS — Z0000 Encounter for general adult medical examination without abnormal findings: Secondary | ICD-10-CM | POA: Diagnosis not present

## 2016-08-09 DIAGNOSIS — F418 Other specified anxiety disorders: Secondary | ICD-10-CM | POA: Diagnosis not present

## 2016-08-23 ENCOUNTER — Encounter: Payer: Self-pay | Admitting: Dietician

## 2016-08-23 ENCOUNTER — Encounter: Payer: 59 | Attending: Internal Medicine | Admitting: Dietician

## 2016-08-23 VITALS — Ht <= 58 in | Wt 130.1 lb

## 2016-08-23 DIAGNOSIS — Z713 Dietary counseling and surveillance: Secondary | ICD-10-CM | POA: Insufficient documentation

## 2016-08-23 NOTE — Progress Notes (Signed)
Notes from [facility] employee "self referral" nutrition session: Start time: 1040   End time: 7331  Met with employee to discuss his/her nutritional concerns and diet history.  Diet history:  Breakfast: instant flavored oatmeal Snack: yogurt or fruit Lunch: lean cuisine meal, or Subway sandwich, or occasionally cheeseburger Snack: none Supper: (husband cooks) spaghetti, fried chicken, wide variety.  Snack: cereal Patient states she does not like to eat much meat.    The employee's questions/concerns were also addressed:  Patient reports having lost about 50lbs over the past several months through increased exercise and calorie counting; she has limited calories to 1100-1200kcal daily. She reports increased eating and not much tracking since Thanksgiving, and has gained about 5lbs. She would like to resume weight loss to goal of 115-118lbs. She has had some bouts of IBS, which are not frequently related to food intake per patient.   We discussed the following topics:  Healthy Eating: importance of maximizing healthy food choices with low kcal intake, consideration of basic multivitamin to help meet basic needs. Instructed on protein needs and meat-alternative protein choices  Weight Concerns: 1200kcal balanced meal plan   Other Medical Conditions: IBS-- importance of easy-to-digest, low fiber diet for at least a few days after episode, possible benefit of probiotic.   I also provided the following handouts as reinforcement of the educational session:  Planning a Balanced Meal  Sample menus and/or recipes  Smart Snacking   Additional Comments:  Patient is generally making healthy food choices, although protein intake might be low with frequent meatless meals or small portions.   Encouraged kcal consumption to allow for 1-2lbs weight loss per month as she nears her goal weight.    Goals Agreed Upon:  Use divided plate for portioning meals, smaller portions of starches and adequate  portions of proteins.   Aim for 60g protein daily, track either with app or on paper. Each ounce or 1/4 cup of a protein food = 7g protein. Each starch serving = 3g protein. Each "free" vegetable serving = 2g protein. 1 cup Greek yogurt = 12-15g protein.   Continue with regular exercise 3-4 days each week.   Consider taking a basic multivitamin, perhaps with probiotic, and/or a separate probiotic such as align to help maintain adequate nutrition and healthy GI bacteria count.

## 2016-08-23 NOTE — Patient Instructions (Signed)
   Use divided plate for portioning meals, smaller portions of starches and adequate portions of proteins.   Aim for 60g protein daily, track either with app or on paper. Each ounce or 1/4 cup of a protein food = 7g protein. Each starch serving = 3g protein. Each "free" vegetable serving = 2g protein. 1 cup Greek yogurt = 12-15g protein.   Continue with regular exercise 3-4 days each week.   Consider taking a basic multivitamin, perhaps with probiotic, and/or a separate probiotic such as align to help maintain adequate nutrition and healthy GI bacteria count.

## 2016-11-12 ENCOUNTER — Other Ambulatory Visit: Payer: Self-pay | Admitting: *Deleted

## 2016-11-12 MED ORDER — FLUCONAZOLE 150 MG PO TABS: 150.0000 mg | ORAL_TABLET | Freq: Once | ORAL | 2 refills | Status: AC

## 2016-11-15 DIAGNOSIS — M461 Sacroiliitis, not elsewhere classified: Secondary | ICD-10-CM | POA: Diagnosis not present

## 2016-11-15 DIAGNOSIS — M9903 Segmental and somatic dysfunction of lumbar region: Secondary | ICD-10-CM | POA: Diagnosis not present

## 2016-11-15 DIAGNOSIS — M9904 Segmental and somatic dysfunction of sacral region: Secondary | ICD-10-CM | POA: Diagnosis not present

## 2016-11-15 DIAGNOSIS — M545 Low back pain: Secondary | ICD-10-CM | POA: Diagnosis not present

## 2016-11-15 DIAGNOSIS — M5413 Radiculopathy, cervicothoracic region: Secondary | ICD-10-CM | POA: Diagnosis not present

## 2016-11-15 DIAGNOSIS — M9901 Segmental and somatic dysfunction of cervical region: Secondary | ICD-10-CM | POA: Diagnosis not present

## 2016-12-27 ENCOUNTER — Other Ambulatory Visit: Payer: Self-pay | Admitting: Certified Nurse Midwife

## 2016-12-28 ENCOUNTER — Other Ambulatory Visit: Payer: Self-pay | Admitting: Certified Nurse Midwife

## 2016-12-28 ENCOUNTER — Other Ambulatory Visit: Payer: Self-pay | Admitting: *Deleted

## 2016-12-28 DIAGNOSIS — R300 Dysuria: Secondary | ICD-10-CM | POA: Diagnosis not present

## 2016-12-30 LAB — URINE CULTURE: Organism ID, Bacteria: NO GROWTH

## 2017-01-04 ENCOUNTER — Other Ambulatory Visit: Payer: Self-pay | Admitting: *Deleted

## 2017-01-04 MED ORDER — ESCITALOPRAM OXALATE 10 MG PO TABS: 10.0000 mg | ORAL_TABLET | Freq: Every day | ORAL | 6 refills | Status: DC

## 2017-01-04 MED ORDER — FLUTICASONE PROPIONATE 50 MCG/ACT NA SUSP: 2.0000 | Freq: Every day | NASAL | 3 refills | Status: DC

## 2017-02-17 ENCOUNTER — Other Ambulatory Visit: Payer: Self-pay | Admitting: Certified Nurse Midwife

## 2017-02-17 MED ORDER — PROCHLORPERAZINE MALEATE 10 MG PO TABS: 10.0000 mg | ORAL_TABLET | Freq: Four times a day (QID) | ORAL | 0 refills | Status: DC | PRN

## 2017-02-21 DIAGNOSIS — G43001 Migraine without aura, not intractable, with status migrainosus: Secondary | ICD-10-CM | POA: Diagnosis not present

## 2017-02-21 DIAGNOSIS — F419 Anxiety disorder, unspecified: Secondary | ICD-10-CM | POA: Diagnosis not present

## 2017-02-22 ENCOUNTER — Other Ambulatory Visit: Payer: Self-pay | Admitting: Certified Nurse Midwife

## 2017-02-22 MED ORDER — PANTOPRAZOLE SODIUM 20 MG PO TBEC: 20.0000 mg | DELAYED_RELEASE_TABLET | Freq: Every day | ORAL | 3 refills | Status: DC

## 2017-02-22 MED ORDER — ONDANSETRON HCL 4 MG PO TABS: 4.0000 mg | ORAL_TABLET | Freq: Three times a day (TID) | ORAL | 0 refills | Status: DC | PRN

## 2017-02-22 MED ORDER — LINACLOTIDE 290 MCG PO CAPS
290.0000 ug | ORAL_CAPSULE | Freq: Every day | ORAL | 6 refills | Status: DC
Start: 2017-02-22 — End: 2018-02-17

## 2017-03-30 ENCOUNTER — Telehealth: Payer: Self-pay | Admitting: *Deleted

## 2017-03-30 NOTE — Telephone Encounter (Signed)
Pt would like to start the weight management, pls advise

## 2017-03-30 NOTE — Telephone Encounter (Signed)
No problem, have her make nurse visit appt to start

## 2017-04-18 DIAGNOSIS — H5213 Myopia, bilateral: Secondary | ICD-10-CM | POA: Diagnosis not present

## 2017-04-18 DIAGNOSIS — H52223 Regular astigmatism, bilateral: Secondary | ICD-10-CM | POA: Diagnosis not present

## 2017-05-17 ENCOUNTER — Other Ambulatory Visit: Payer: Self-pay | Admitting: Certified Nurse Midwife

## 2017-05-17 MED ORDER — TINIDAZOLE 500 MG PO TABS: 1.0000 g | ORAL_TABLET | Freq: Every day | ORAL | 0 refills | Status: AC

## 2017-05-19 ENCOUNTER — Other Ambulatory Visit: Payer: Self-pay | Admitting: *Deleted

## 2017-05-19 MED ORDER — FLUCONAZOLE 150 MG PO TABS: 150.0000 mg | ORAL_TABLET | Freq: Once | ORAL | 4 refills | Status: AC

## 2017-07-20 ENCOUNTER — Ambulatory Visit (INDEPENDENT_AMBULATORY_CARE_PROVIDER_SITE_OTHER): Payer: 59 | Admitting: Obstetrics and Gynecology

## 2017-07-20 ENCOUNTER — Encounter: Payer: Self-pay | Admitting: Obstetrics and Gynecology

## 2017-07-20 VITALS — BP 116/73 | HR 96 | Ht <= 58 in | Wt 135.0 lb

## 2017-07-20 DIAGNOSIS — N941 Unspecified dyspareunia: Secondary | ICD-10-CM

## 2017-07-20 DIAGNOSIS — Z8639 Personal history of other endocrine, nutritional and metabolic disease: Secondary | ICD-10-CM | POA: Diagnosis not present

## 2017-07-20 DIAGNOSIS — Z0000 Encounter for general adult medical examination without abnormal findings: Secondary | ICD-10-CM | POA: Diagnosis not present

## 2017-07-20 DIAGNOSIS — Z124 Encounter for screening for malignant neoplasm of cervix: Secondary | ICD-10-CM

## 2017-07-20 DIAGNOSIS — N946 Dysmenorrhea, unspecified: Secondary | ICD-10-CM

## 2017-07-20 NOTE — Progress Notes (Signed)
GYNECOLOGY ANNUAL PHYSICAL EXAM PROGRESS NOTE  Subjective:    Carolyn Barnes is a 31 y.o. G72P1021 female who presents for an annual exam. The patient has no complaints today. The patient is sexually active. GYN screening history: last pap: approximate date 3 years ago and was normal. The patient wears seatbelts: yes. The patient participates in regular exercise: yes. Has the patient ever been transfused or tattooed?: no. The patient reports that there is not domestic violence in her life.    Gynecologic History  Menarche age: 41 Patient's last menstrual period was 07/03/2017. Contraception: vasectomy History of STI's: Denies Last Pap: 3 years ago, approximately. Results were: normal.  Denies h/o abnormal pap smears.    Obstetric History   G4   P2   T2   P0   A2   L2    SAB2   TAB0   Ectopic0   Multiple0   Live Births2     # Outcome Date GA Lbr Len/2nd Weight Sex Delivery Anes PTL Lv  4 Term 05/29/15 [redacted]w[redacted]d  7 lb 8.3 oz (3.41 kg) F Vag-Spont  Y LIV  3 SAB 2015     SAB   FD  2 SAB 2014     SAB   FD  1 Term 2010 [redacted]w[redacted]d  6 lb 2 oz (2.778 kg) F Vag-Spont  N LIV     Name: [name]      Past Medical History:  Diagnosis Date  . Anxiety   . Fatigue   . Indigestion   . Migraine headache   . Seasonal allergies   . Vitamin D deficiency disease     Past Surgical History:  Procedure Laterality Date  . DILATION AND CURETTAGE OF UTERUS  02/2013,10/2013  . TONSILLECTOMY    . umbilical hernia repair      Family History  Problem Relation Age of Onset  . Heart disease Maternal Grandmother   . Diabetes Maternal Grandmother     Social History   Socioeconomic History  . Marital status: Married    Spouse name: Not on file  . Number of children: Not on file  . Years of education: Not on file  . Highest education level: Not on file  Social Needs  . Financial resource strain: Not on file  . Food insecurity - worry: Not on file  . Food insecurity - inability: Not on file  .  Transportation needs - medical: Not on file  . Transportation needs - non-medical: Not on file  Occupational History  . Not on file  Tobacco Use  . Smoking status: Never Smoker  . Smokeless tobacco: Never Used  Substance and Sexual Activity  . Alcohol use: No  . Drug use: No  . Sexual activity: Yes    Birth control/protection: None  Other Topics Concern  . Not on file  Social History Narrative  . Not on file    Current Outpatient Medications on File Prior to Visit  Medication Sig Dispense Refill  . Biotin 10 MG CAPS Take 1 tablet by mouth 1 day or 1 dose.    . escitalopram (LEXAPRO) 10 MG tablet Take 1 tablet (10 mg total) by mouth daily. 30 tablet 6  . linaclotide (LINZESS) 290 MCG CAPS capsule Take 1 capsule (290 mcg total) by mouth daily. 30 capsule 6  . pantoprazole (PROTONIX) 20 MG tablet Take 1 tablet (20 mg total) by mouth daily. (Patient not taking: Reported on 07/20/2017) 30 tablet 3   No current facility-administered  medications on file prior to visit.     No Known Allergies   Review of Systems Constitutional: negative for chills, fatigue, fevers and sweats Eyes: negative for irritation, redness and visual disturbance Ears, nose, mouth, throat, and face: negative for hearing loss, nasal congestion, snoring and tinnitus Respiratory: negative for asthma, cough, sputum Cardiovascular: negative for chest pain, dyspnea, exertional chest pressure/discomfort, irregular heart beat, palpitations and syncope Gastrointestinal: negative for abdominal pain, change in bowel habits, nausea and vomiting Genitourinary: negative for abnormal menstrual periods, but positive for moderate dysmenorrhea and mild dyspareunia. Negative for genital lesions, sexual problems and vaginal discharge, dysuria and urinary incontinence.  Integument/breast: negative for breast lump, breast tenderness and nipple discharge Hematologic/lymphatic: negative for bleeding and easy  bruising Musculoskeletal:negative for back pain and muscle weakness Neurological: negative for dizziness, headaches, vertigo and weakness Endocrine: negative for diabetic symptoms including polydipsia, polyuria and skin dryness Allergic/Immunologic: negative for hay fever and urticaria       Objective:  Blood pressure 116/73, pulse 96, height 4' 9.5" (1.461 m), weight 135 lb (61.2 kg), last menstrual period 07/03/2017. Body mass index is 28.71 kg/m.  General Appearance:    Alert, cooperative, no distress, appears stated age, overweight  Head:    Normocephalic, without obvious abnormality, atraumatic  Eyes:    PERRL, conjunctiva/corneas clear, EOM's intact, both eyes  Ears:    Normal external ear canals, both ears  Nose:   Nares normal, septum midline, mucosa normal, no drainage or sinus tenderness  Throat:   Lips, mucosa, and tongue normal; teeth and gums normal  Neck:   Supple, symmetrical, trachea midline, no adenopathy; thyroid: no enlargement/tenderness/nodules; no carotid bruit or JVD  Back:     Symmetric, no curvature, ROM normal, no CVA tenderness  Lungs:     Clear to auscultation bilaterally, respirations unlabored  Chest Wall:    No tenderness or deformity   Heart:    Regular rate and rhythm, S1 and S2 normal, no murmur, rub or gallop  Breast Exam:    No tenderness, masses, or nipple abnormality  Abdomen:     Soft, non-tender, bowel sounds active all four quadrants, no masses, no organomegaly.    Genitalia:    Pelvic:external genitalia normal, vagina without lesions, discharge, or tenderness, rectovaginal septum  normal. Cervix normal in appearance, no cervical motion tenderness, no adnexal masses or tenderness.  Uterus normal size, shape, mobile, regular contours, nontender.  Rectal:    Normal external sphincter.  No hemorrhoids appreciated. Internal exam not done.   Extremities:   Extremities normal, atraumatic, no cyanosis or edema  Pulses:   2+ and symmetric all extremities   Skin:   Skin color, texture, turgor normal, no rashes or lesions  Lymph nodes:   Cervical, supraclavicular, and axillary nodes normal  Neurologic:   CNII-XII intact, normal strength, sensation and reflexes throughout   .  Labs:  Lab Results  Component Value Date   WBC 4.5 12/04/2015   HGB 13.2 12/04/2015   HCT 38.1 12/04/2015   MCV 92 12/04/2015   PLT 300 12/04/2015    Lab Results  Component Value Date   CREATININE 0.75 12/04/2015   BUN 11 12/04/2015   NA 139 12/04/2015   K 4.1 12/04/2015   CL 103 12/04/2015   CO2 21 12/04/2015    Lab Results  Component Value Date   ALT 15 12/04/2015   AST 15 12/04/2015   ALKPHOS 53 12/04/2015   BILITOT 0.3 12/04/2015    No results found for:  TSH   Assessment:   Healthy female exam.  Dysmenorrhea Dyspareunia H/o Vitamin D deficiency  Plan:     Blood tests: CBC with diff, Comprehensive metabolic panel and Vitamin D. Breast self exam technique reviewed and patient encouraged to perform self-exam monthly. Contraception: vasectomy. Discussed healthy lifestyle modifications. Pap smear performed today.   Dysmenorrhea and dsypareunia.  Discussed possibility of endometriosis as potential diagnosis.  Advised on use of hormonal therapy to see if symptoms improve.  Patient has previously been against use of hormonal therapy, but now notes she may consider it.  Can offer samples once decision made, if desired. Has received flu vaccine at work ([employer]).    [name], MD [facility]

## 2017-07-21 LAB — VITAMIN D 25 HYDROXY (VIT D DEFICIENCY, FRACTURES): Vit D, 25-Hydroxy: 20.8 ng/mL — ABNORMAL LOW (ref 30.0–100.0)

## 2017-07-21 LAB — CBC
HEMATOCRIT: 39.7 % (ref 34.0–46.6)
Hemoglobin: 12.7 g/dL (ref 11.1–15.9)
MCH: 31.8 pg (ref 26.6–33.0)
MCHC: 32 g/dL (ref 31.5–35.7)
MCV: 99 fL — AB (ref 79–97)
Platelets: 234 10*3/uL (ref 150–379)
RBC: 4 x10E6/uL (ref 3.77–5.28)
RDW: 12.5 % (ref 12.3–15.4)
WBC: 6.8 10*3/uL (ref 3.4–10.8)

## 2017-07-21 LAB — COMPREHENSIVE METABOLIC PANEL
ALBUMIN: 4.5 g/dL (ref 3.5–5.5)
ALK PHOS: 41 IU/L (ref 39–117)
ALT: 10 IU/L (ref 0–32)
AST: 18 IU/L (ref 0–40)
Albumin/Globulin Ratio: 1.7 (ref 1.2–2.2)
BILIRUBIN TOTAL: 0.3 mg/dL (ref 0.0–1.2)
BUN / CREAT RATIO: 9 (ref 9–23)
BUN: 8 mg/dL (ref 6–20)
CHLORIDE: 106 mmol/L (ref 96–106)
CO2: 26 mmol/L (ref 20–29)
Calcium: 9.2 mg/dL (ref 8.7–10.2)
Creatinine, Ser: 0.92 mg/dL (ref 0.57–1.00)
GFR calc Af Amer: 97 mL/min/{1.73_m2} (ref >59)
GFR calc non Af Amer: 84 mL/min/{1.73_m2} (ref >59)
GLUCOSE: 86 mg/dL (ref 65–99)
Globulin, Total: 2.7 g/dL (ref 1.5–4.5)
Potassium: 3.9 mmol/L (ref 3.5–5.2)
SODIUM: 140 mmol/L (ref 134–144)
Total Protein: 7.2 g/dL (ref 6.0–8.5)

## 2017-07-23 LAB — IGP, COBASHPV16/18
HPV 16: NEGATIVE
HPV 18: NEGATIVE
HPV other hr types: NEGATIVE
PAP SMEAR COMMENT: 0

## 2017-08-26 ENCOUNTER — Other Ambulatory Visit: Payer: Self-pay | Admitting: Certified Nurse Midwife

## 2017-11-12 ENCOUNTER — Other Ambulatory Visit: Payer: Self-pay | Admitting: Certified Nurse Midwife

## 2017-11-12 MED ORDER — OSELTAMIVIR PHOSPHATE 75 MG PO CAPS: 75.0000 mg | ORAL_CAPSULE | Freq: Every day | ORAL | 0 refills | Status: AC

## 2018-02-17 ENCOUNTER — Other Ambulatory Visit: Payer: Self-pay

## 2018-02-17 ENCOUNTER — Encounter: Payer: Self-pay | Admitting: Gynecology

## 2018-02-17 ENCOUNTER — Ambulatory Visit
Admission: EM | Admit: 2018-02-17 | Discharge: 2018-02-17 | Disposition: A | Discharge status: Home / Self Care | Payer: No Typology Code available for payment source | Attending: Family Medicine | Admitting: Family Medicine

## 2018-02-17 DIAGNOSIS — H6981 Other specified disorders of Eustachian tube, right ear: Secondary | ICD-10-CM | POA: Diagnosis not present

## 2018-02-17 MED ORDER — NAPROXEN 500 MG PO TABS: 500.0000 mg | ORAL_TABLET | Freq: Two times a day (BID) | ORAL | 0 refills | Status: DC

## 2018-02-17 NOTE — ED Provider Notes (Signed)
[facility]    CSN: [id number] Arrival date & time: 02/17/18  1808     History   Chief Complaint Chief Complaint  Patient presents with  . Headache    HPI [name] is a 32 y.o. female.   HPI  32 year old female [name] presents with a headache that she has had over a week.  This is also part of a respiratory infection that she has had for the week as well.  States that she was coughing today and felt a pop in her right ear followed by pain feels deep inside the ear and along the jaw line in the neck.  She has pain on the right side of her head  and ear.  Had no fever or chills.  She has a headache that is migrainous-like and usually responds well to the anti-inflammatory drugs.      Past Medical History:  Diagnosis Date  . Anxiety   . Fatigue   . Indigestion   . Migraine headache   . Seasonal allergies   . Vitamin D deficiency disease     Patient Active Problem List   Diagnosis Date Noted  . Headache, migraine 09/02/2015  . Allergic rhinitis 09/02/2015  . Acid reflux 09/02/2015  . Pregnancy 05/29/2015  . Chronic constipation 03/07/2015  . Short stature 03/07/2015    Past Surgical History:  Procedure Laterality Date  . DILATION AND CURETTAGE OF UTERUS  02/2013,10/2013  . TONSILLECTOMY    . umbilical hernia repair      OB History    Gravida  4   Para  2   Term  2   Preterm      AB  2   Living  2     SAB  2   TAB      Ectopic      Multiple      Live Births  2            Home Medications    Prior to Admission medications   Medication Sig Start Date End Date Taking? Authorizing Provider  Biotin 10 MG CAPS Take 1 tablet by mouth 1 day or 1 dose.   Yes [provider]  escitalopram (LEXAPRO) 10 MG tablet Take 1 tablet (10 mg total) by mouth daily. 01/04/17  Yes Shambley, Melody N, CNM  pantoprazole (PROTONIX) 20 MG tablet Take 1 tablet (20 mg total) by mouth daily. 02/22/17  Yes  Lawhorn, Lara Mulch, CNM  naproxen (NAPROSYN) 500 MG tablet Take 1 tablet (500 mg total) by mouth 2 (two) times daily with a meal. 02/17/18   Lorin Picket, PA-C    Family History Family History  Problem Relation Age of Onset  . Heart disease Maternal Grandmother   . Diabetes Maternal Grandmother     Social History Social History   Tobacco Use  . Smoking status: Never Smoker  . Smokeless tobacco: Never Used  Substance Use Topics  . Alcohol use: No  . Drug use: No     Allergies   Patient has no known allergies.   Review of Systems Review of Systems  Constitutional: Negative for activity change, appetite change, chills, fatigue and fever.  HENT: Positive for congestion, ear pain, sinus pressure and sinus pain.   Respiratory: Positive for cough.   Neurological: Positive for headaches.  All other systems reviewed and are negative.    Physical Exam Triage Vital Signs ED Triage Vitals  Enc Vitals Group  BP 02/17/18 1823 125/88     Pulse Rate 02/17/18 1823 68     Resp 02/17/18 1823 16     Temp 02/17/18 1823 98.7 F (37.1 C)     Temp Source 02/17/18 1823 Oral     SpO2 02/17/18 1823 100 %     Weight 02/17/18 1819 140 lb (63.5 kg)     Height 02/17/18 1819 4\' 10"  (1.473 m)     Head Circumference --      Peak Flow --      Pain Score 02/17/18 1819 6     Pain Loc --      Pain Edu? --      Excl. in [place]? --    No data found.  Updated Vital Signs BP 125/88 (BP Location: Left Arm)   Pulse 68   Temp 98.7 F (37.1 C) (Oral)   Resp 16   Ht 4\' 10"  (1.473 m)   Wt 140 lb (63.5 kg)   LMP 02/13/2018   SpO2 100%   BMI 29.26 kg/m   Visual Acuity Right Eye Distance:   Left Eye Distance:   Bilateral Distance:    Right Eye Near:   Left Eye Near:    Bilateral Near:     Physical Exam  Constitutional: She is oriented to person, place, and time. She appears well-developed and well-nourished.  Non-toxic appearance. She does not appear ill. No distress.  HENT:    Head: Normocephalic.  Mouth/Throat: Oropharynx is clear and moist.  TMs are normal. No Discomfort with tugging on the tragus or auricle.  Eyes: Pupils are equal, round, and reactive to light. Right eye exhibits normal extraocular motion. Left eye exhibits normal extraocular motion.  Neck: Normal range of motion. Neck supple.  Pulmonary/Chest: Effort normal and breath sounds normal.  Neurological: She is alert and oriented to person, place, and time. She has normal strength. GCS eye subscore is 4. GCS verbal subscore is 5. GCS motor subscore is 6.  Skin: Skin is warm and dry.  Psychiatric: She has a normal mood and affect. Her behavior is normal.  Nursing note and vitals reviewed.    UC Treatments / Results  Labs (all labs ordered are listed, but only abnormal results are displayed) Labs Reviewed - No data to display  EKG None  Radiology No results found.  Procedures Procedures (including critical care time)  Medications Ordered in UC Medications - No data to display  Initial Impression / Assessment and Plan / UC Course  I have reviewed the triage vital signs and the nursing notes.  Pertinent labs & imaging results that were available during my care of the patient were reviewed by me and considered in my medical decision making (see chart for details).     Plan: 1. Test/x-ray results and diagnosis reviewed with patient 2. rx as per orders; risks, benefits, potential side effects reviewed with patient 3. Recommend supportive treatment with Flonase nasal spray on a daily basis for the next 2 to 4 weeks.  Not improving recommend following up with ear nose and throat.  Also given her prescription for Naprosyn for her migraine headaches.  She should not take ibuprofen or Aleve while taking the Naprosyn.  She should always take the medication with food 4. F/u prn if symptoms worsen or don't improve  Final Clinical Impressions(s) / UC Diagnoses   Final diagnoses:  Eustachian  tube dysfunction, right     Discharge Instructions     Use Flonase nasal spray daily for  2 to 4 weeks.  If not improving  arrange an appointment with Dr.Juengle.    ED Prescriptions    Medication Sig Dispense Auth. Provider   naproxen (NAPROSYN) 500 MG tablet Take 1 tablet (500 mg total) by mouth 2 (two) times daily with a meal. 60 tablet Lorin Picket, PA-C     Controlled Substance Prescriptions [place] Controlled Substance Registry consulted? Not Applicable   Lorin Picket, PA-C 02/17/18 1914

## 2018-02-17 NOTE — ED Triage Notes (Signed)
Per patient with headache over 1 week. Patient state after coughing today felt pop at right ear. Per patient  now with pain at right side head and ear.

## 2018-02-17 NOTE — Discharge Instructions (Signed)
Use Flonase nasal spray daily for 2 to 4 weeks.  If not improving  arrange an appointment with Dr.Juengle.

## 2018-02-27 ENCOUNTER — Other Ambulatory Visit: Payer: Self-pay | Admitting: Certified Nurse Midwife

## 2018-02-27 MED ORDER — FLUCONAZOLE 150 MG PO TABS: 150.0000 mg | ORAL_TABLET | Freq: Once | ORAL | 1 refills | Status: AC

## 2018-03-14 ENCOUNTER — Encounter: Payer: Self-pay | Admitting: Internal Medicine

## 2018-03-17 ENCOUNTER — Encounter: Payer: Self-pay | Admitting: Internal Medicine

## 2018-03-17 ENCOUNTER — Ambulatory Visit
Admission: RE | Admit: 2018-03-17 | Discharge: 2018-03-17 | Disposition: A | Discharge status: Home / Self Care | Payer: No Typology Code available for payment source | Source: Ambulatory Visit | Attending: Internal Medicine | Admitting: Internal Medicine

## 2018-03-17 ENCOUNTER — Ambulatory Visit (INDEPENDENT_AMBULATORY_CARE_PROVIDER_SITE_OTHER): Payer: No Typology Code available for payment source | Admitting: Internal Medicine

## 2018-03-17 VITALS — BP 98/80 | HR 75 | Resp 16 | Ht <= 58 in | Wt 135.0 lb

## 2018-03-17 DIAGNOSIS — M25562 Pain in left knee: Secondary | ICD-10-CM | POA: Insufficient documentation

## 2018-03-17 DIAGNOSIS — M7989 Other specified soft tissue disorders: Secondary | ICD-10-CM | POA: Diagnosis not present

## 2018-03-17 DIAGNOSIS — K219 Gastro-esophageal reflux disease without esophagitis: Secondary | ICD-10-CM | POA: Diagnosis not present

## 2018-03-17 DIAGNOSIS — M25569 Pain in unspecified knee: Secondary | ICD-10-CM | POA: Diagnosis not present

## 2018-03-17 DIAGNOSIS — F325 Major depressive disorder, single episode, in full remission: Secondary | ICD-10-CM | POA: Diagnosis not present

## 2018-03-17 DIAGNOSIS — G43009 Migraine without aura, not intractable, without status migrainosus: Secondary | ICD-10-CM | POA: Diagnosis not present

## 2018-03-17 DIAGNOSIS — E559 Vitamin D deficiency, unspecified: Secondary | ICD-10-CM | POA: Insufficient documentation

## 2018-03-17 MED ORDER — PANTOPRAZOLE SODIUM 20 MG PO TBEC: 20.0000 mg | DELAYED_RELEASE_TABLET | Freq: Every day | ORAL | 1 refills | Status: DC

## 2018-03-17 MED ORDER — TOPIRAMATE 50 MG PO TABS: 200.0000 mg | ORAL_TABLET | Freq: Every day | ORAL | 1 refills | Status: DC

## 2018-03-17 MED ORDER — ESCITALOPRAM OXALATE 20 MG PO TABS: 20.0000 mg | ORAL_TABLET | Freq: Every day | ORAL | 1 refills | Status: DC

## 2018-03-17 MED ORDER — PREDNISONE 10 MG PO TABS: ORAL_TABLET | ORAL | 0 refills | Status: DC

## 2018-03-17 NOTE — Patient Instructions (Signed)
Titrate up topiramate 50 mg at bedtime for 1-2 weeks then increase to 100 mg at bedtime, etc to maximum dose 200 mg.

## 2018-03-17 NOTE — Progress Notes (Signed)
Date:  03/17/2018   Name:  Carolyn Barnes   DOB:  [birth year]   MRN:  [id number]   Chief Complaint: Establish Care; Headache; and Knee Pain (bilateral) Headache   This is a recurrent problem. The problem occurs daily (almost daily for a week or two then may go away for 2-3 weeks before resuming). The pain is located in the frontal region. The pain quality is similar to prior headaches. The quality of the pain is described as throbbing and stabbing (and others are tight, frontal pressure). The pain is mild. Associated symptoms include sinus pressure. Pertinent negatives include no blurred vision, coughing, dizziness, fever, hearing loss, nausea, phonophobia, photophobia, vomiting or weakness. Nothing aggravates the symptoms. She has tried triptans for the symptoms. The treatment provided no relief (from triptans). (She also had sinus type headaches that worsen with leaving over.)  Knee Pain   There was no injury mechanism. The pain is present in the right knee and left knee. The quality of the pain is described as aching. The pain is at a severity of 4/10. The pain is moderate. The pain has been constant since onset. She reports no foreign bodies present. The symptoms are aggravated by weight bearing. She has tried NSAIDs for the symptoms. The treatment provided moderate relief.  Depression         This is a chronic problem.  The current episode started more than 1 year ago.   Onset quality: started several years ago after her last child. The problem is unchanged.  Associated symptoms include irritable and headaches.  Associated symptoms include no fatigue, no helplessness, no hopelessness, not sad and no suicidal ideas.  Past treatments include SSRIs - Selective serotonin reuptake inhibitors.  Compliance with treatment is good.  Previous treatment provided significant relief.  (She also had sinus type headaches that worsen with leaving over.)    Review of Systems  Constitutional: Negative for  chills, fatigue and fever.  HENT: Positive for congestion and sinus pressure. Negative for hearing loss, postnasal drip and trouble swallowing.   Eyes: Negative for blurred vision and photophobia.  Respiratory: Negative for cough, chest tightness and wheezing.   Cardiovascular: Negative for chest pain, palpitations and leg swelling.  Gastrointestinal: Negative for nausea and vomiting.  Musculoskeletal: Positive for arthralgias, gait problem and joint swelling.  Skin: Negative for color change and rash.  Neurological: Positive for headaches. Negative for dizziness, tremors, weakness and light-headedness.  Psychiatric/Behavioral: Positive for depression. Negative for dysphoric mood, sleep disturbance and suicidal ideas. The patient is nervous/anxious (some irritability).     Patient Active Problem List   Diagnosis Date Noted  . Vitamin D deficiency 03/17/2018  . Headache, migraine 09/02/2015  . Allergic rhinitis 09/02/2015  . Acid reflux 09/02/2015  . Chronic constipation 03/07/2015  . Short stature 03/07/2015    Prior to Admission medications   Medication Sig Start Date End Date Taking? Authorizing Provider  Biotin 10 MG CAPS Take 1 tablet by mouth 1 day or 1 dose.   Yes [provider]  escitalopram (LEXAPRO) 20 MG tablet Take 20 mg by mouth daily. 03/01/18  Yes [provider]  naproxen (NAPROSYN) 500 MG tablet Take 1 tablet (500 mg total) by mouth 2 (two) times daily with a meal. 02/17/18  Yes Lorin Picket, PA-C  pantoprazole (PROTONIX) 20 MG tablet Take 1 tablet (20 mg total) by mouth daily. 02/22/17  Yes Lawhorn, Lara Mulch, CNM    No Known Allergies  Past Surgical History:  Procedure Laterality Date  . DILATION AND CURETTAGE OF UTERUS  02/2013,10/2013    Social History   Tobacco Use  . Smoking status: Never Smoker  . Smokeless tobacco: Never Used  Substance Use Topics  . Alcohol use: No  . Drug use: No     Medication list has been reviewed  and updated.  Current Meds  Medication Sig  . Biotin 10 MG CAPS Take 1 tablet by mouth 1 day or 1 dose.  . escitalopram (LEXAPRO) 20 MG tablet Take 20 mg by mouth daily.  . naproxen (NAPROSYN) 500 MG tablet Take 1 tablet (500 mg total) by mouth 2 (two) times daily with a meal.  . pantoprazole (PROTONIX) 20 MG tablet Take 1 tablet (20 mg total) by mouth daily.  . [DISCONTINUED] rizatriptan (MAXALT-MLT) 10 MG disintegrating tablet Take by mouth.    PHQ 2/9 Scores 03/17/2018 08/23/2016 07/11/2015  PHQ - 2 Score 0 0 1   GAD 7 : Generalized Anxiety Score 03/17/2018  Nervous, Anxious, on Edge 1  Control/stop worrying 0  Worry too much - different things 2  Trouble relaxing 2  Restless 2  Easily annoyed or irritable 3  Afraid - awful might happen 0  Total GAD 7 Score 10  Anxiety Difficulty Not difficult at all     Physical Exam  Constitutional: She is oriented to person, place, and time. She appears well-developed. She is irritable. No distress.  HENT:  Head: Normocephalic and atraumatic.  Right Ear: Tympanic membrane and ear canal normal.  Left Ear: Tympanic membrane and ear canal normal.  Nose: Right sinus exhibits no maxillary sinus tenderness and no frontal sinus tenderness. Left sinus exhibits no maxillary sinus tenderness and no frontal sinus tenderness.  Mouth/Throat: Oropharynx is clear and moist. No posterior oropharyngeal edema or posterior oropharyngeal erythema.  Neck: Neck supple.  Cardiovascular: Normal rate, regular rhythm and normal heart sounds.  Pulmonary/Chest: Effort normal. No respiratory distress.  Musculoskeletal: Normal range of motion.       Right knee: She exhibits effusion (small). She exhibits normal range of motion. No tenderness found.       Left knee: She exhibits effusion (moderate). She exhibits normal range of motion. Tenderness found. Medial joint line tenderness noted.  No TMJ click or abnormal excursion No temporal artery tenderness  Neurological:  She is alert and oriented to person, place, and time. She has normal strength. No cranial nerve deficit or sensory deficit.  Reflex Scores:      Patellar reflexes are 2+ on the right side and 2+ on the left side. Skin: Skin is warm and dry. No rash noted.  Psychiatric: She has a normal mood and affect. Her speech is normal and behavior is normal. Thought content normal.  Nursing note and vitals reviewed.   BP 98/80   Pulse 75   Resp 16   Ht 4\' 10"  (1.473 m)   Wt 135 lb (61.2 kg)   LMP 03/16/2018   SpO2 100%   BMI 28.22 kg/m   Assessment and Plan: 1. Migraine without aura and without status migrainosus, not intractable Vs daily chronic headache vs nsaid rebound headache Stop advil and use Tylenol as needed Titrate up topiramate - Basic metabolic panel - topiramate (TOPAMAX) 50 MG tablet; Take 4 tablets (200 mg total) by mouth at bedtime.  Dispense: 120 tablet; Refill: 1  2. Knee pain, unspecified chronicity, unspecified laterality Rule out autoimmune arthritis Prednisone taper then tylenol as needed - DG Knee Complete 4 Views Left; Future -  Sedimentation rate - Rheumatoid factor - ANA w/Reflex if Positive - predniSONE (DELTASONE) 10 MG tablet; Take 6 on day 1, 5 on day 2, 4 on day 3, 3 on day 4, 2 on day 5 and 1 on day 1 then stop.  Dispense: 21 tablet; Refill: 0  3. Gastroesophageal reflux disease, esophagitis presence not specified Avoid all nsaids; take tylenol prn - pantoprazole (PROTONIX) 20 MG tablet; Take 1 tablet (20 mg total) by mouth daily.  Dispense: 90 tablet; Refill: 1  4. Major depressive disorder with single episode, in full remission ([place]) Doing well on Lexapro - pt reassured she can continue indefinitely for now - escitalopram (LEXAPRO) 20 MG tablet; Take 1 tablet (20 mg total) by mouth daily.  Dispense: 90 tablet; Refill: 1   Meds ordered this encounter  Medications  . predniSONE (DELTASONE) 10 MG tablet    Sig: Take 6 on day 1, 5 on day 2, 4 on day 3,  3 on day 4, 2 on day 5 and 1 on day 1 then stop.    Dispense:  21 tablet    Refill:  0  . topiramate (TOPAMAX) 50 MG tablet    Sig: Take 4 tablets (200 mg total) by mouth at bedtime.    Dispense:  120 tablet    Refill:  1  . escitalopram (LEXAPRO) 20 MG tablet    Sig: Take 1 tablet (20 mg total) by mouth daily.    Dispense:  90 tablet    Refill:  1  . pantoprazole (PROTONIX) 20 MG tablet    Sig: Take 1 tablet (20 mg total) by mouth daily.    Dispense:  90 tablet    Refill:  1    Partially dictated using Editor, commissioning. Any errors are unintentional.  Halina Maidens, MD [facility]  03/17/2018

## 2018-03-18 LAB — BASIC METABOLIC PANEL
BUN/Creatinine Ratio: 11 (ref 9–23)
BUN: 10 mg/dL (ref 6–20)
CALCIUM: 9.3 mg/dL (ref 8.7–10.2)
CHLORIDE: 107 mmol/L — AB (ref 96–106)
CO2: 24 mmol/L (ref 20–29)
Creatinine, Ser: 0.88 mg/dL (ref 0.57–1.00)
GFR calc non Af Amer: 88 mL/min/{1.73_m2} (ref >59)
GFR, EST AFRICAN AMERICAN: 101 mL/min/{1.73_m2} (ref >59)
Glucose: 72 mg/dL (ref 65–99)
POTASSIUM: 4.1 mmol/L (ref 3.5–5.2)
Sodium: 142 mmol/L (ref 134–144)

## 2018-03-18 LAB — RHEUMATOID FACTOR: Rhuematoid fact SerPl-aCnc: <10 IU/mL (ref 0.0–13.9)

## 2018-03-18 LAB — SEDIMENTATION RATE: SED RATE: 2 mm/h (ref 0–32)

## 2018-03-18 LAB — ANA W/REFLEX IF POSITIVE: ANA: NEGATIVE

## 2018-05-01 ENCOUNTER — Encounter: Payer: Self-pay | Admitting: Internal Medicine

## 2018-05-01 ENCOUNTER — Ambulatory Visit (INDEPENDENT_AMBULATORY_CARE_PROVIDER_SITE_OTHER): Payer: No Typology Code available for payment source | Admitting: Internal Medicine

## 2018-05-01 VITALS — BP 112/68 | HR 78 | Ht <= 58 in | Wt 142.0 lb

## 2018-05-01 DIAGNOSIS — M2142 Flat foot [pes planus] (acquired), left foot: Secondary | ICD-10-CM

## 2018-05-01 DIAGNOSIS — M2141 Flat foot [pes planus] (acquired), right foot: Secondary | ICD-10-CM | POA: Diagnosis not present

## 2018-05-01 DIAGNOSIS — G43009 Migraine without aura, not intractable, without status migrainosus: Secondary | ICD-10-CM | POA: Diagnosis not present

## 2018-05-01 DIAGNOSIS — M25569 Pain in unspecified knee: Secondary | ICD-10-CM

## 2018-05-01 DIAGNOSIS — F325 Major depressive disorder, single episode, in full remission: Secondary | ICD-10-CM

## 2018-05-01 NOTE — Progress Notes (Signed)
Date:  05/01/2018   Name:  Carolyn Barnes   DOB:  [birth year]   MRN:  [id number]   Chief Complaint: Migraine and Knee Pain Migraine   This is a recurrent problem. The problem has been gradually improving. The pain is mild. Pertinent negatives include no abdominal pain, dizziness, fever or weakness. Treatments tried: topiramate added last visit but pt never tried it.  The treatment provided significant (only 2 migraines since last visit) relief.  Knee Pain   There was no injury mechanism. The pain is present in the left knee and right knee. The pain is moderate. The pain has been fluctuating since onset. Associated symptoms comments: Labs work up negative for rheumatologic disease. The symptoms are aggravated by weight bearing. She has tried NSAIDs (prednisone taper) for the symptoms. The treatment provided no relief.   She has managed her migraines by stopping all NSAIDS, starting regular exercise and working to reduce her overall stress level.   She has some abdominal discomfort a few weeks ago and felt her abdomen.  She thought there was a mass, possibly a hernia.  She has flat feet - starting to hurt more despite wearing good supportive shoes at work.   Lab Results  Component Value Date   ANA Negative 03/17/2018   RF <10.0 03/17/2018   Lab Results  Component Value Date   ESRSEDRATE 2 03/17/2018   Xrays were negative of the left knee.   Review of Systems  Constitutional: Negative for chills, fatigue and fever.  Respiratory: Negative for chest tightness and stridor.   Cardiovascular: Negative for chest pain and palpitations.  Gastrointestinal: Positive for abdominal distention (thinks she may have an abdominal hernia). Negative for abdominal pain.  Musculoskeletal: Positive for arthralgias and gait problem.  Skin: Negative for rash.  Neurological: Positive for headaches. Negative for dizziness, syncope and weakness.  Hematological: Negative for adenopathy.    Psychiatric/Behavioral: Negative for sleep disturbance.    Patient Active Problem List   Diagnosis Date Noted  . Bilateral pes planus 05/01/2018  . Vitamin D deficiency 03/17/2018  . Knee pain 03/17/2018  . Major depressive disorder with single episode, in full remission ([place]) 03/17/2018  . Headache, migraine 09/02/2015  . Allergic rhinitis 09/02/2015  . Acid reflux 09/02/2015  . Chronic constipation 03/07/2015  . Short stature 03/07/2015    No Known Allergies  Past Surgical History:  Procedure Laterality Date  . DILATION AND CURETTAGE OF UTERUS  02/2013,10/2013    Social History   Tobacco Use  . Smoking status: Never Smoker  . Smokeless tobacco: Never Used  Substance Use Topics  . Alcohol use: No  . Drug use: No     Medication list has been reviewed and updated.  Current Meds  Medication Sig  . Biotin 10 MG CAPS Take 1 tablet by mouth 1 day or 1 dose.  . escitalopram (LEXAPRO) 20 MG tablet Take 1 tablet (20 mg total) by mouth daily.  . pantoprazole (PROTONIX) 20 MG tablet Take 1 tablet (20 mg total) by mouth daily.    PHQ 2/9 Scores 05/01/2018 03/17/2018 08/23/2016 07/11/2015  PHQ - 2 Score 0 0 0 1  PHQ- 9 Score 4 - - -    Physical Exam  Constitutional: She is oriented to person, place, and time. She appears well-developed. No distress.  HENT:  Head: Normocephalic and atraumatic.  Neck: Normal range of motion. Neck supple.  Cardiovascular: Normal rate, regular rhythm and normal heart sounds.  Pulmonary/Chest: Effort normal and breath  sounds normal. No respiratory distress.  Abdominal: Soft. Bowel sounds are normal. She exhibits no distension and no mass. There is no tenderness. There is no guarding. No hernia.    Musculoskeletal:       Right knee: She exhibits decreased range of motion and effusion. Tenderness found.       Left knee: She exhibits decreased range of motion and effusion. Tenderness found.  Both feet demonstrate lack of arch and mild eversion   Neurological: She is alert and oriented to person, place, and time.  Skin: Skin is warm and dry. No rash noted.  Psychiatric: She has a normal mood and affect. Her behavior is normal. Thought content normal.  Nursing note and vitals reviewed.   BP 112/68   Pulse 78   Ht 4\' 10"  (1.473 m)   Wt 142 lb (64.4 kg)   SpO2 98%   BMI 29.68 kg/m   Assessment and Plan: 1. Migraine without aura and without status migrainosus, not intractable Much improved with d/c nsaids, life style changes  2. Major depressive disorder with single episode, in full remission ([place]) Doing well on SSRI  3. Knee pain, unspecified chronicity, unspecified laterality Still sx after prednisone taper Labs and xrays unrevealing - Ambulatory referral to Orthopedic Surgery  4. Acquired bilateral flat feet - Ambulatory referral to Podiatry   No orders of the defined types were placed in this encounter.   Partially dictated using Editor, commissioning. Any errors are unintentional.  Halina Maidens, MD [facility]  05/01/2018

## 2018-05-24 ENCOUNTER — Ambulatory Visit: Payer: Self-pay | Admitting: Podiatry

## 2018-06-07 ENCOUNTER — Encounter: Payer: Self-pay | Admitting: Podiatry

## 2018-06-07 ENCOUNTER — Ambulatory Visit: Payer: No Typology Code available for payment source | Admitting: Podiatry

## 2018-06-07 ENCOUNTER — Ambulatory Visit (INDEPENDENT_AMBULATORY_CARE_PROVIDER_SITE_OTHER): Payer: No Typology Code available for payment source

## 2018-06-07 VITALS — BP 117/75 | HR 80 | Resp 16

## 2018-06-07 DIAGNOSIS — M2141 Flat foot [pes planus] (acquired), right foot: Secondary | ICD-10-CM

## 2018-06-07 DIAGNOSIS — M2142 Flat foot [pes planus] (acquired), left foot: Secondary | ICD-10-CM | POA: Diagnosis not present

## 2018-06-07 NOTE — Progress Notes (Signed)
  Subjective:  Patient ID: Carolyn Barnes, female    DOB: [birth year],  MRN: [id number] HPI Chief Complaint  Patient presents with  . Foot Pain    Flat feet bilateral - feet hurt a lot with activity, noticing more knee pain  . New Patient (Initial Visit)    32 y.o. female presents with the above complaint.   ROS: Denies fever chills nausea vomiting muscle aches pains calf pain back pain chest pain shortness of breath.  Past Medical History:  Diagnosis Date  . Anxiety   . Fatigue   . Indigestion   . Migraine headache   . Seasonal allergies   . Vitamin D deficiency disease    Past Surgical History:  Procedure Laterality Date  . DILATION AND CURETTAGE OF UTERUS  02/2013,10/2013    Current Outpatient Medications:  .  topiramate (TOPAMAX) 50 MG tablet, Take by mouth., Disp: , Rfl:  .  Biotin 10 MG CAPS, Take 1 tablet by mouth 1 day or 1 dose., Disp: , Rfl:  .  escitalopram (LEXAPRO) 20 MG tablet, Take 1 tablet (20 mg total) by mouth daily., Disp: 90 tablet, Rfl: 1 .  pantoprazole (PROTONIX) 20 MG tablet, Take 1 tablet (20 mg total) by mouth daily., Disp: 90 tablet, Rfl: 1  No Known Allergies Review of Systems Objective:   Vitals:   06/07/18 0834  BP: 117/75  Pulse: 80  Resp: 16    General: Well developed, nourished, in no acute distress, alert and oriented x3   Dermatological: Skin is warm, dry and supple bilateral. Nails x 10 are well maintained; remaining integument appears unremarkable at this time. There are no open sores, no preulcerative lesions, no rash or signs of infection present.  Vascular: Dorsalis Pedis artery and Posterior Tibial artery pedal pulses are 2/4 bilateral with immedate capillary fill time. Pedal hair growth present. No varicosities and no lower extremity edema present bilateral.   Neruologic: Grossly intact via light touch bilateral. Vibratory intact via tuning fork bilateral. Protective threshold with Semmes Wienstein monofilament intact to all  pedal sites bilateral. Patellar and Achilles deep tendon reflexes 2+ bilateral. No Babinski or clonus noted bilateral.   Musculoskeletal: No gross boney pedal deformities bilateral. No pain, crepitus, or limitation noted with foot and ankle range of motion bilateral. Muscular strength 5/5 in all groups tested bilateral.  Flexible pes planus bilateral.  Week posterior tibial tendons  Gait: Unassisted, Nonantalgic.    Radiographs:  Radiographs taken today demonstrate pes planus no other osseous abnormalities.  No coalitions visible.  Assessment & Plan:   Assessment: Plantar fasciitis with associated knee pain bilateral.  Mild posterior tibial tendon dysfunction.  Plan: Sent her for orthotic fabrication.     Carolyn Barnes T. [place], [place]

## 2018-06-28 ENCOUNTER — Encounter: Payer: Self-pay | Admitting: Internal Medicine

## 2018-06-28 NOTE — Telephone Encounter (Signed)
Patient response. Please advise.

## 2018-06-28 NOTE — Telephone Encounter (Signed)
Patient my chart message about PMS. Please Advise.

## 2018-08-05 ENCOUNTER — Telehealth: Payer: No Typology Code available for payment source | Admitting: Nurse Practitioner

## 2018-08-05 DIAGNOSIS — B379 Candidiasis, unspecified: Secondary | ICD-10-CM

## 2018-08-05 NOTE — Progress Notes (Signed)
We are sorry that you are not feeling well. Here is how we plan to help! Based on what you shared with me it looks like you: May have a yeast vaginosis  Vaginosis is an inflammation of the vagina that can result in discharge, itching and pain. The cause is usually a change in the normal balance of vaginal bacteria or an infection. Vaginosis can also result from reduced estrogen levels after menopause.  The most common causes of vaginosis are:   Bacterial vaginosis which results from an overgrowth of one on several organisms that are normally present in your vagina.   Yeast infections which are caused by a naturally occurring fungus called candida.   Vaginal atrophy (atrophic vaginosis) which results from the thinning of the vagina from reduced estrogen levels after menopause.   Trichomoniasis which is caused by a parasite and is commonly transmitted by sexual intercourse.  Factors that increase your risk of developing vaginosis include: . Medications, such as antibiotics and steroids . Uncontrolled diabetes . Use of hygiene products such as bubble bath, vaginal spray or vaginal deodorant . Douching . Wearing damp or tight-fitting clothing . Using an intrauterine device (IUD) for birth control . Hormonal changes, such as those associated with pregnancy, birth control pills or menopause . Sexual activity . Having a sexually transmitted infection  Your treatment plan is Monistat (miconazole) or Gyne-Lotrimin (clotrimazole) over the counter at most phamacies.  Be sure to take all of the medication as directed. Stop taking any medication if you develop a rash, tongue swelling or shortness of breath. Mothers who are breast feeding should consider pumping and discarding their breast milk while on these antibiotics. However, there is no consensus that infant exposure at these doses would be harmful.  Remember that medication creams can weaken latex condoms. .   HOME CARE:  Good hygiene may  prevent some types of vaginosis from recurring and may relieve some symptoms:  . Avoid baths, hot tubs and whirlpool spas. Rinse soap from your outer genital area after a shower, and dry the area well to prevent irritation. Don't use scented or harsh soaps, such as those with deodorant or antibacterial action. . Avoid irritants. These include scented tampons and pads. . Wipe from front to back after using the toilet. Doing so avoids spreading fecal bacteria to your vagina.  Other things that may help prevent vaginosis include:  . Don't douche. Your vagina doesn't require cleansing other than normal bathing. Repetitive douching disrupts the normal organisms that reside in the vagina and can actually increase your risk of vaginal infection. Douching won't clear up a vaginal infection. . Use a latex condom. Both female and female latex condoms may help you avoid infections spread by sexual contact. . Wear cotton underwear. Also wear pantyhose with a cotton crotch. If you feel comfortable without it, skip wearing underwear to bed. Yeast thrives in moist environments Your symptoms should improve in the next day or two.  GET HELP RIGHT AWAY IF:  . You have pain in your lower abdomen ( pelvic area or over your ovaries) . You develop nausea or vomiting . You develop a fever . Your discharge changes or worsens . You have persistent pain with intercourse . You develop shortness of breath, a rapid pulse, or you faint.  These symptoms could be signs of problems or infections that need to be evaluated by a medical provider now.  MAKE SURE YOU    Understand these instructions.  Will watch your condition.    Will get help right away if you are not doing well or get worse.  Your e-visit answers were reviewed by a board certified advanced clinical practitioner to complete your personal care plan. Depending upon the condition, your plan could have included both over the counter or prescription  medications. Please review your pharmacy choice to make sure that you have choses a pharmacy that is open for you to pick up any needed prescription, Your safety is important to us. If you have drug allergies check your prescription carefully.   You can use MyChart to ask questions about today's visit, request a non-urgent call back, or ask for a work or school excuse for 24 hours related to this e-Visit. If it has been greater than 24 hours you will need to follow up with your provider, or enter a new e-Visit to address those concerns. You will get a MyChart message within the next two days asking about your experience. I hope that your e-visit has been valuable and will speed your recovery.  

## 2018-10-04 ENCOUNTER — Other Ambulatory Visit: Payer: Self-pay

## 2018-10-04 MED ORDER — OSELTAMIVIR PHOSPHATE 75 MG PO CAPS: 75.0000 mg | ORAL_CAPSULE | Freq: Every day | ORAL | 0 refills | Status: AC

## 2018-10-10 ENCOUNTER — Other Ambulatory Visit: Payer: Self-pay | Admitting: Certified Nurse Midwife

## 2018-10-10 MED ORDER — CYCLOBENZAPRINE HCL 5 MG PO TABS: 5.0000 mg | ORAL_TABLET | Freq: Three times a day (TID) | ORAL | 0 refills | Status: DC | PRN

## 2018-11-01 ENCOUNTER — Ambulatory Visit: Payer: No Typology Code available for payment source | Admitting: Internal Medicine

## 2018-11-06 ENCOUNTER — Encounter: Payer: Self-pay | Admitting: Internal Medicine

## 2018-11-06 ENCOUNTER — Other Ambulatory Visit: Payer: Self-pay

## 2018-11-06 ENCOUNTER — Other Ambulatory Visit: Payer: Self-pay | Admitting: Internal Medicine

## 2018-11-06 DIAGNOSIS — F325 Major depressive disorder, single episode, in full remission: Secondary | ICD-10-CM

## 2018-11-08 ENCOUNTER — Ambulatory Visit: Payer: No Typology Code available for payment source | Admitting: Internal Medicine

## 2018-11-14 ENCOUNTER — Encounter: Payer: Self-pay | Admitting: Internal Medicine

## 2018-11-14 ENCOUNTER — Other Ambulatory Visit: Payer: Self-pay

## 2018-11-14 ENCOUNTER — Ambulatory Visit (INDEPENDENT_AMBULATORY_CARE_PROVIDER_SITE_OTHER): Payer: No Typology Code available for payment source | Admitting: Internal Medicine

## 2018-11-14 VITALS — BP 114/68 | HR 88 | Ht <= 58 in | Wt 150.0 lb

## 2018-11-14 DIAGNOSIS — F325 Major depressive disorder, single episode, in full remission: Secondary | ICD-10-CM | POA: Diagnosis not present

## 2018-11-14 DIAGNOSIS — K219 Gastro-esophageal reflux disease without esophagitis: Secondary | ICD-10-CM | POA: Diagnosis not present

## 2018-11-14 MED ORDER — ESCITALOPRAM OXALATE 5 MG PO TABS: 5.0000 mg | ORAL_TABLET | Freq: Every day | ORAL | 0 refills | Status: DC

## 2018-11-14 NOTE — Progress Notes (Signed)
Date:  11/14/2018   Name:  Carolyn Barnes   DOB:  [birth year]   MRN:  [id number]   Chief Complaint: Depression (Wants to discuss weaning off lexapro. )  Depression         This is a chronic problem.  The problem has been resolved since onset.  Associated symptoms include no appetite change and no headaches.  Past treatments include SSRIs - Selective serotonin reuptake inhibitors.  Previous treatment provided significant relief. She is doing well and did not think that medication really helped her anxiety.  In addition, she has gained quite a bit of weight.  Review of Systems  Constitutional: Negative for appetite change, diaphoresis and unexpected weight change.  Respiratory: Negative for chest tightness and shortness of breath.   Cardiovascular: Negative for chest pain, palpitations and leg swelling.  Skin: Negative for color change and wound.       Several moles   Neurological: Negative for dizziness, light-headedness and headaches.  Psychiatric/Behavioral: Positive for depression. Negative for dysphoric mood and sleep disturbance. The patient is not nervous/anxious.     Patient Active Problem List   Diagnosis Date Noted  . Bilateral pes planus 05/01/2018  . Vitamin D deficiency 03/17/2018  . Knee pain 03/17/2018  . Major depressive disorder with single episode, in full remission ([place]) 03/17/2018  . Headache, migraine 09/02/2015  . Allergic rhinitis 09/02/2015  . Acid reflux 09/02/2015  . Chronic constipation 03/07/2015  . Short stature 03/07/2015    No Known Allergies  Past Surgical History:  Procedure Laterality Date  . DILATION AND CURETTAGE OF UTERUS  02/2013,10/2013    Social History   Tobacco Use  . Smoking status: Never Smoker  . Smokeless tobacco: Never Used  Substance Use Topics  . Alcohol use: No  . Drug use: No     Medication list has been reviewed and updated.  Current Meds  Medication Sig  . Biotin 10 MG CAPS Take 1 tablet by mouth 1 day or 1  dose.  . Calcium Carb-Cholecalciferol (CALCIUM 1000 + D PO)   . cyclobenzaprine (FLEXERIL) 5 MG tablet Take 1 tablet (5 mg total) by mouth 3 (three) times daily as needed for muscle spasms.  Marland Kitchen escitalopram (LEXAPRO) 20 MG tablet TAKE 1 TABLET BY MOUTH DAILY  . pantoprazole (PROTONIX) 20 MG tablet Take 1 tablet (20 mg total) by mouth daily.  Marland Kitchen topiramate (TOPAMAX) 50 MG tablet Take by mouth.    PHQ 2/9 Scores 11/14/2018 05/01/2018 03/17/2018 08/23/2016  PHQ - 2 Score 0 0 0 0  PHQ- 9 Score - 4 - -   Wt Readings from Last 3 Encounters:  11/14/18 150 lb (68 kg)  05/01/18 142 lb (64.4 kg)  03/17/18 135 lb (61.2 kg)    Physical Exam Constitutional:      Appearance: Normal appearance.  HENT:     Head: Normocephalic and atraumatic.  Neck:     Musculoskeletal: Normal range of motion.  Cardiovascular:     Rate and Rhythm: Normal rate and regular rhythm.     Pulses: Normal pulses.  Pulmonary:     Effort: Pulmonary effort is normal.     Breath sounds: Normal breath sounds.  Musculoskeletal: Normal range of motion.  Neurological:     Mental Status: She is alert.  Psychiatric:        Mood and Affect: Mood normal.        Behavior: Behavior normal.        Thought Content: Thought content  normal.        Judgment: Judgment normal.     BP 114/68   Pulse 88   Ht 4\' 10"  (1.473 m)   Wt 150 lb (68 kg)   LMP  (Exact Date)   SpO2 97%   BMI 31.35 kg/m   Assessment and Plan: 1. Major depressive disorder with single episode, in full remission ([place]) Taper and discontinue lexapro - taper given - escitalopram (LEXAPRO) 5 MG tablet; Take 1 tablet (5 mg total) by mouth daily.  Dispense: 90 tablet; Refill: 0  2. Gastroesophageal reflux disease, esophagitis presence not specified Controlled on PPI   Partially dictated using Editor, commissioning. Any errors are unintentional.  Halina Maidens, MD [facility]  11/14/2018

## 2018-11-14 NOTE — Patient Instructions (Addendum)
Lexapro 10 mg daily for 3 weeks  10 mg alternate with 5 for 3 weeks  5 mg daily for 3 weeks  2.5 daily for 3 weeks then stop

## 2018-11-20 ENCOUNTER — Ambulatory Visit (INDEPENDENT_AMBULATORY_CARE_PROVIDER_SITE_OTHER): Payer: Self-pay | Admitting: Physician Assistant

## 2018-11-20 ENCOUNTER — Encounter: Payer: Self-pay | Admitting: Physician Assistant

## 2018-11-20 VITALS — BP 120/80 | HR 89 | Temp 100.4°F | Resp 16 | Wt 152.0 lb

## 2018-11-20 DIAGNOSIS — J069 Acute upper respiratory infection, unspecified: Secondary | ICD-10-CM

## 2018-11-20 MED ORDER — FLUTICASONE PROPIONATE 50 MCG/ACT NA SUSP: 2.0000 | Freq: Every day | NASAL | 0 refills | Status: AC

## 2018-11-20 MED ORDER — BENZONATATE 100 MG PO CAPS: 100.0000 mg | ORAL_CAPSULE | Freq: Three times a day (TID) | ORAL | 0 refills | Status: DC | PRN

## 2018-11-20 NOTE — Patient Instructions (Addendum)
Viral Respiratory Infection   This is consistent with viral illness. I recommend starting tessalon perles for cough. Flonase for congestion. Use over the counter delsym for cough and over the counter ibuprofen or tylenol for general discomfort and fever. Make sure you are resting and staying hydrated. If you are not noticing any improvement over the next 2 days, please seek care at family doctor or urgent care as you may need additional tests at that time. If any of your symptoms worsen or you develop new chest pain, shortness of breath, heart racing, or other concerning symptoms, seek care immediately at ED.    A viral respiratory infection is an illness that affects parts of the body that are used for breathing. These include the lungs, nose, and throat. It is caused by a germ called a virus. Some examples of this kind of infection are:  A cold.  The flu (influenza).  A respiratory syncytial virus (RSV) infection. A person who gets this illness may have the following symptoms:  A stuffy or runny nose.  Yellow or green fluid in the nose.  A cough.  Sneezing.  Tiredness (fatigue).  Achy muscles.  A sore throat.  Sweating or chills.  A fever.  A headache. Follow these instructions at home: Managing pain and congestion  Take over-the-counter and prescription medicines only as told by your doctor.  If you have a sore throat, gargle with salt water. Do this 3-4 times per day or as needed. To make a salt-water mixture, dissolve -1 tsp of salt in 1 cup of warm water. Make sure that all the salt dissolves.  Use nose drops made from salt water. This helps with stuffiness (congestion). It also helps soften the skin around your nose.  Drink enough fluid to keep your pee (urine) pale yellow. General instructions   Rest as much as possible.  Do not drink alcohol.  Do not use any products that have nicotine or tobacco, such as cigarettes and e-cigarettes. If you need help  quitting, ask your doctor.  Keep all follow-up visits as told by your doctor. This is important. How is this prevented?   Get a flu shot every year. Ask your doctor when you should get your flu shot.  Do not let other people get your germs. If you are sick: ? Stay home from work or school. ? Wash your hands with soap and water often. Wash your hands after you cough or sneeze. If soap and water are not available, use hand sanitizer.  Avoid contact with people who are sick during cold and flu season. This is in fall and winter. Get help if:  Your symptoms last for 10 days or longer.  Your symptoms get worse over time.  You have a fever.  You have very bad pain in your face or forehead.  Parts of your jaw or neck become very swollen. Get help right away if:  You feel pain or pressure in your chest.  You have shortness of breath.  You faint or feel like you will faint.  You keep throwing up (vomiting).  You feel confused. Summary  A viral respiratory infection is an illness that affects parts of the body that are used for breathing.  Examples of this illness include a cold, the flu, and respiratory syncytial virus (RSV) infection.  The infection can cause a runny nose, cough, sneezing, sore throat, and fever.  Follow what your doctor tells you about taking medicines, drinking lots of fluid, washing your  hands, resting at home, and avoiding people who are sick. This information is not intended to replace advice given to you by your health care provider. Make sure you discuss any questions you have with your health care provider. Document Released: 08/12/2008 Document Revised: 10/10/2017 Document Reviewed: 10/10/2017 Elsevier Interactive Patient Education  2019 [employer].

## 2018-11-20 NOTE — Progress Notes (Signed)
MRN: [id number] DOB: 08/21/1986  Subjective:   Carolyn Barnes is a 33 y.o. female presenting for chief complaint of Cough (x2d) .  Reports 2 day history of gradual onset sore throat, runny nose, congestion, and dry cough. This morning her heart was racing in the 90s when she was coughing. Has not noticed a fever but has had some chills. Denies inability to swallow, voice change, productive cough, wheezing, shortness of breath, DOE, lower leg swelling, chest tightness, chest pain and myalgia, vomiting, abdominal pain and diarrhea. Has ibuprofen with no full relief. No known sick contact exposure. No hx of asthma, COPD, DM, heart disease, HTN, and autoimmune disease. Patient has had flu shot this season. Denies smoking. Denies recent travel. Denies any other aggravating or relieving factors, no other questions or concerns.  Review of Systems  Constitutional: Negative for diaphoresis.  Respiratory: Negative for hemoptysis and sputum production.   Skin: Negative for rash.  Neurological: Negative for dizziness.    Carolyn Barnes has a current medication list which includes the following prescription(s): biotin, calcium carb-cholecalciferol, escitalopram, pantoprazole, benzonatate, cyclobenzaprine, escitalopram, and fluticasone. Also has No Known Allergies.  Carolyn Barnes  has a past medical history of Anxiety, Fatigue, Indigestion, Migraine headache, Seasonal allergies, and Vitamin D deficiency disease. Also  has a past surgical history that includes Dilation and curettage of uterus (02/2013,10/2013).   Objective:   Vitals: BP 120/80 (BP Location: Right Arm, Patient Position: Sitting, Cuff Size: Normal)   Pulse 89   Temp (!) 100.4 F (38 C)   Resp 16   Wt 152 lb (68.9 kg)   SpO2 98%   BMI 31.77 kg/m   Physical Exam Vitals signs reviewed.  Constitutional:      General: She is not in acute distress.    Appearance: She is well-developed. She is not ill-appearing or toxic-appearing.  HENT:     Head:  Normocephalic and atraumatic.     Right Ear: Tympanic membrane, ear canal and external ear normal.     Left Ear: Tympanic membrane, ear canal and external ear normal.     Nose: Mucosal edema (R>L) and congestion present.     Right Sinus: No maxillary sinus tenderness or frontal sinus tenderness.     Left Sinus: No maxillary sinus tenderness or frontal sinus tenderness.     Mouth/Throat:     Lips: Pink.     Mouth: Mucous membranes are moist.     Pharynx: Uvula midline. Posterior oropharyngeal erythema (of soft palate) present.     Tonsils: No tonsillar exudate or tonsillar abscesses. Swelling: 1+ on the right. 1+ on the left.     Comments: No erythema of b/l tonsils.  Eyes:     Conjunctiva/sclera: Conjunctivae normal.  Neck:     Musculoskeletal: Normal range of motion.  Cardiovascular:     Rate and Rhythm: Normal rate and regular rhythm.     Heart sounds: Normal heart sounds.  Pulmonary:     Effort: Pulmonary effort is normal. No tachypnea or respiratory distress.     Breath sounds: Normal breath sounds. No decreased breath sounds, wheezing, rhonchi or rales.  Lymphadenopathy:     Head:     Right side of head: No submental, submandibular, tonsillar, preauricular, posterior auricular or occipital adenopathy.     Left side of head: No submental, submandibular, tonsillar, preauricular, posterior auricular or occipital adenopathy.     Cervical: No cervical adenopathy.     Upper Body:     Right upper body: No supraclavicular adenopathy.  Left upper body: No supraclavicular adenopathy.  Skin:    General: Skin is warm and dry.  Neurological:     Mental Status: She is alert.     No results found for this or any previous visit (from the past 24 hour(s)).  Assessment and Plan :  1. Viral URI Patient is overall well-appearing, no acute distress.  Temp 100.4, no tachycardia.  Heart RRR.  No recent use of antipyretcis.  History and physical exam are consistent with viral URI, not  excluding influenza.  Do not suspect a lower respiratory infection as cough is nonproductive, no shortness of breath or dyspnea on exertion, and lungs CTAB.  Given educational material on viral URI. Considering she is otherwise healthy 33 yo female with no comordities, recommend symptomatic treatment at this time.  Recommend rest, oral hydration, and eating light meals.  May use Flonase for congestion, over-the-counter Delsym for cough and over-the-counter ibuprofen or Tylenol as prescribed as needed for fever and general discomfort.  Would advise close follow-up with family doctor or urgent care in 2 days if she is not noticing any improvement in symptoms as she may warrant further testing and imaging at that time.  Encouraged her to seek care sooner at local urgent care or ED if symptoms worsen/develop new concerning symptoms.  Patient voices understanding.   Meds ordered this encounter  Medications  . benzonatate (TESSALON) 100 MG capsule    Sig: Take 1-2 capsules (100-200 mg total) by mouth 3 (three) times daily as needed for cough.    Dispense:  40 capsule    Refill:  0    Order Specific Question:   Supervising Provider    Answer:   MILLER, BRIAN [[id number]]  . fluticasone (FLONASE) 50 MCG/ACT nasal spray    Sig: Place 2 sprays into both nostrils daily.    Dispense:  16 g    Refill:  0    Order Specific Question:   Supervising Provider    Answer:   Sabra Heck, BRIAN [[id number]]     Tenna Delaine, [facility] 11/20/2018 5:04 PM

## 2018-11-23 ENCOUNTER — Telehealth: Payer: Self-pay | Admitting: Emergency Medicine

## 2018-11-23 NOTE — Telephone Encounter (Signed)
Left message following up on visit with [facility] 

## 2018-12-15 IMAGING — CR DG KNEE COMPLETE 4+V*L*
4 series · 4 of 4 positions shown · non-contrast
Comparison: None.

CLINICAL DATA: Pain and swelling

EXAM:
LEFT KNEE - COMPLETE 4+ VIEW

[knee ap]
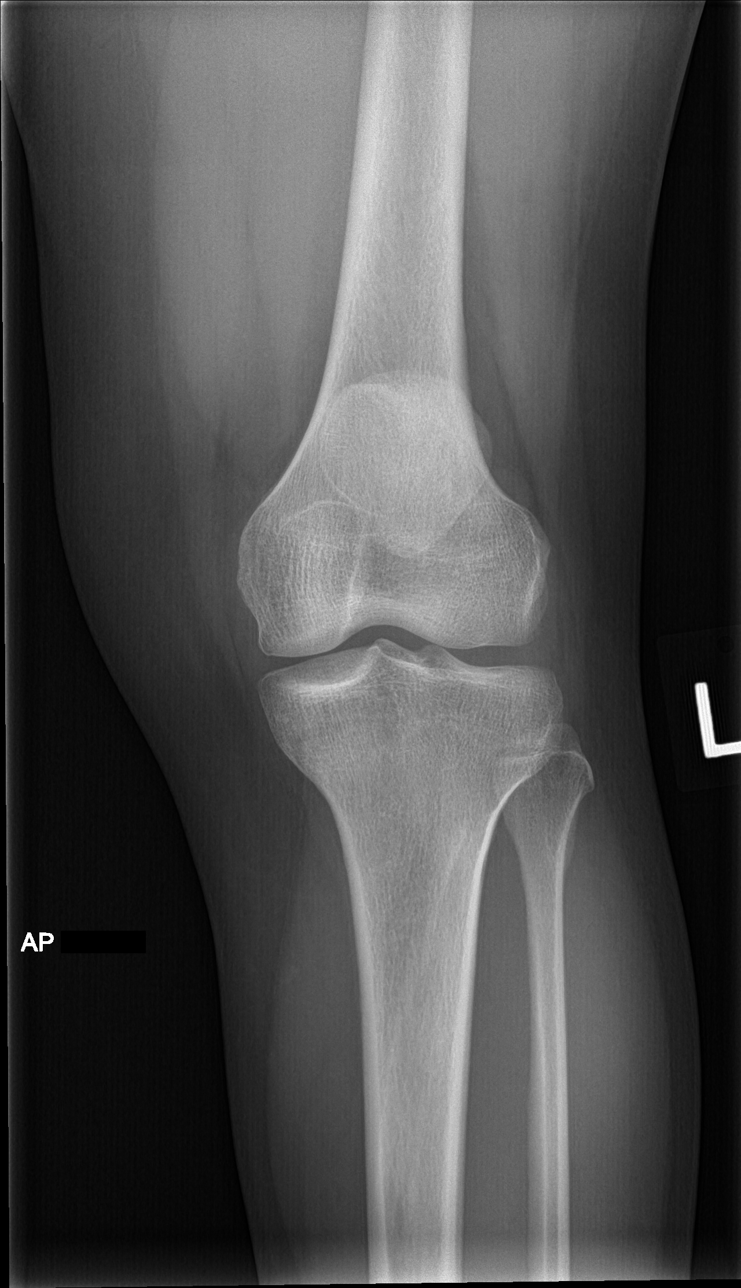

[knee lat]
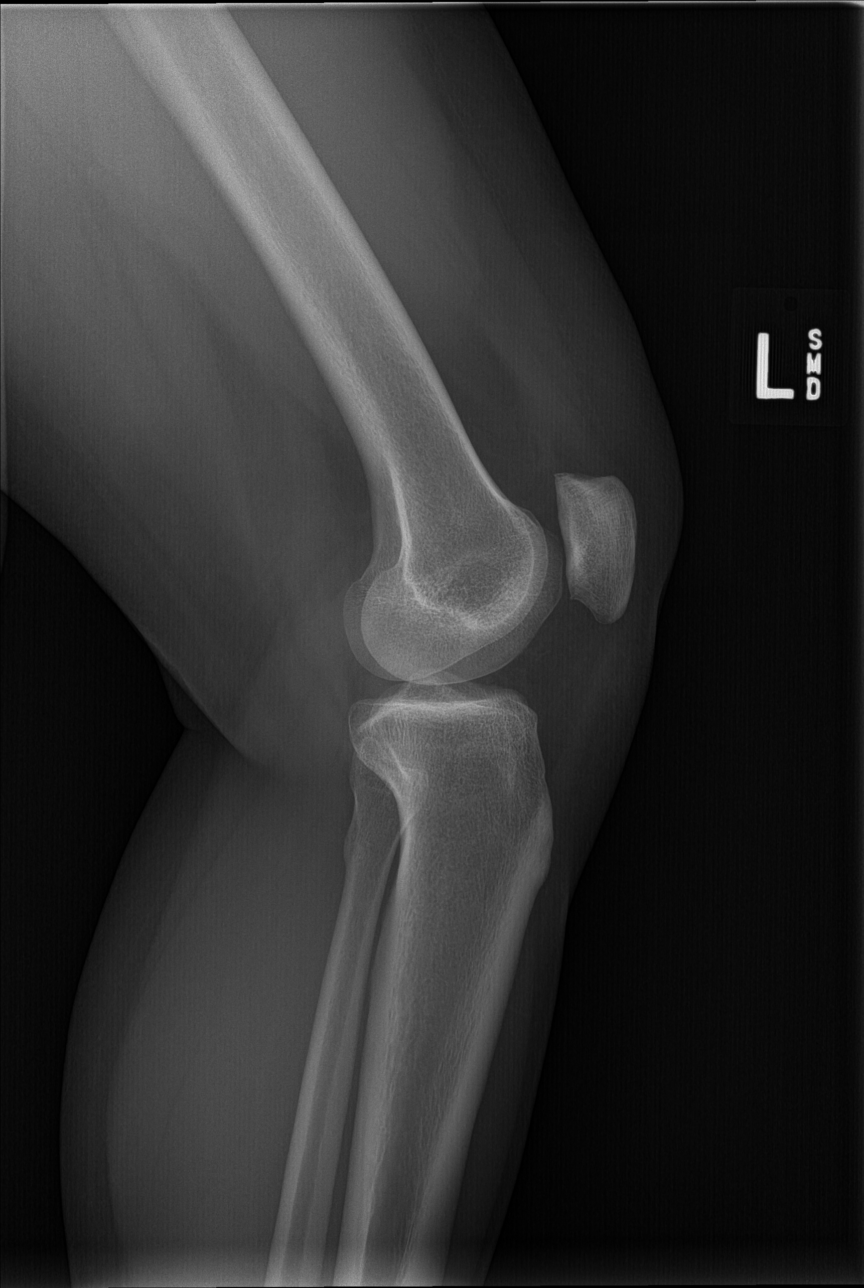

[tunnel]
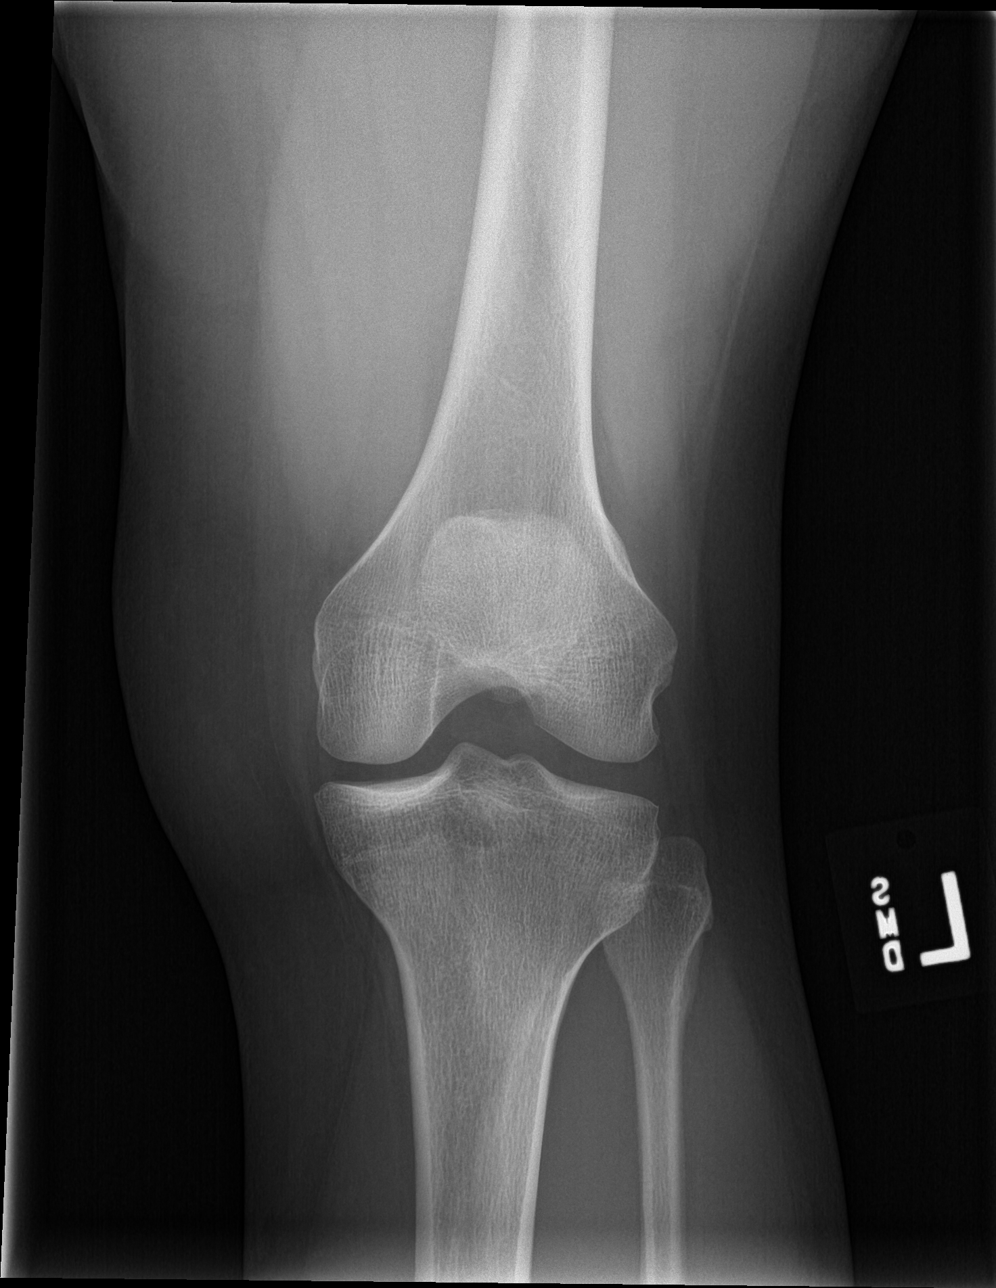

[patella skyline]
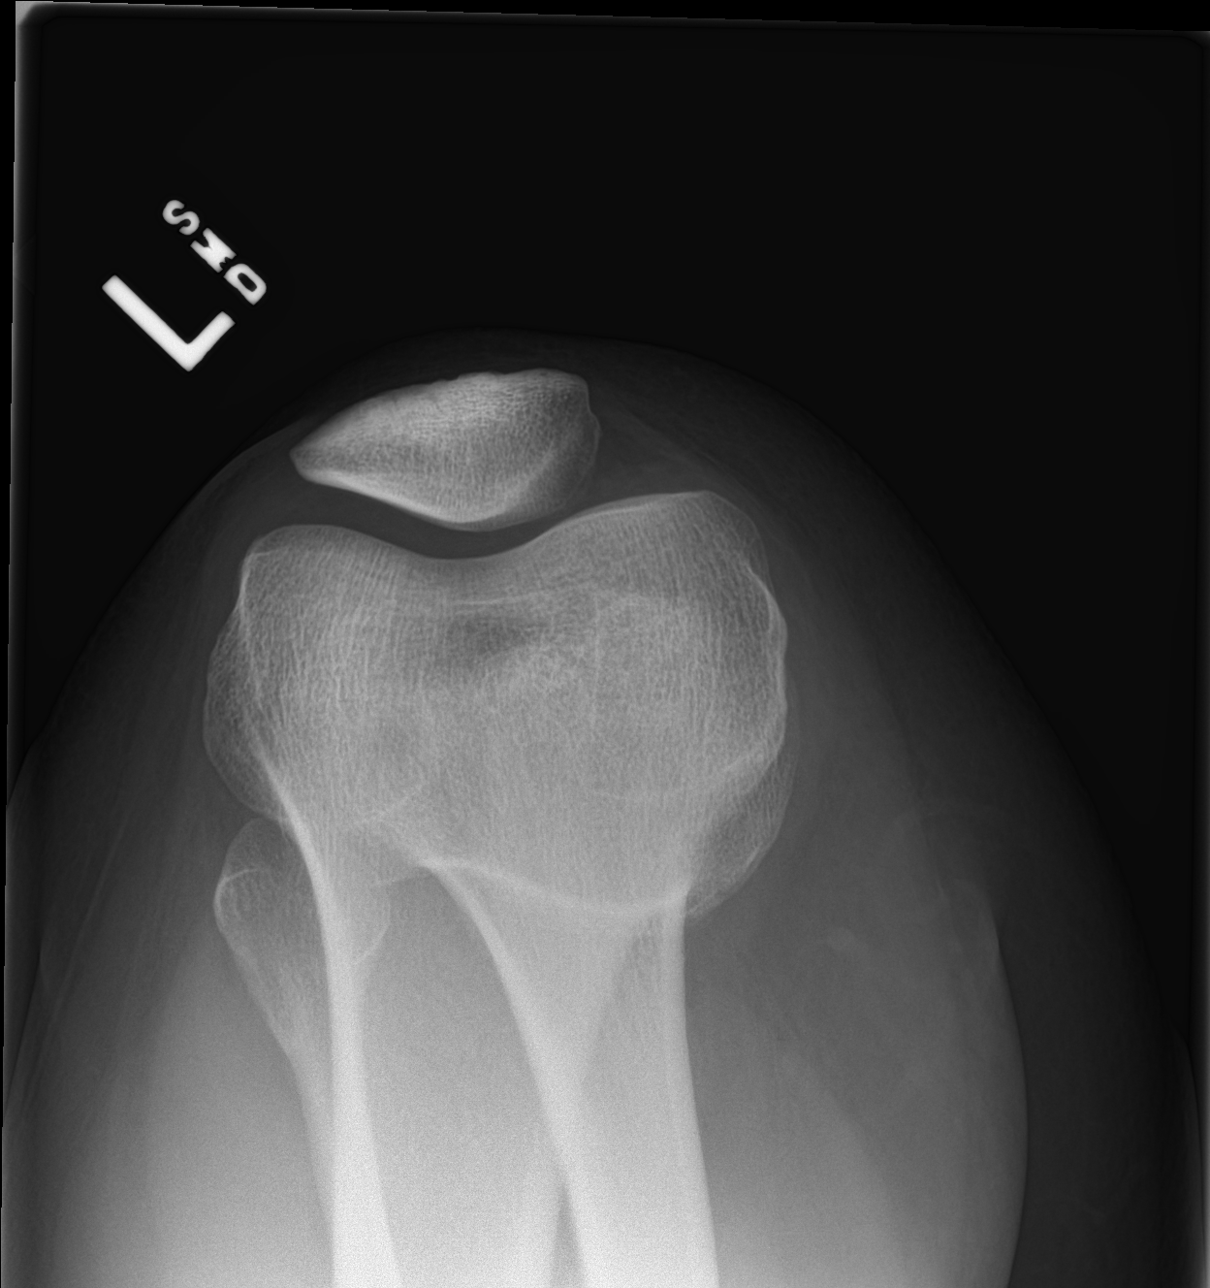

[4 of 4 positions shown; findings below may reference images not displayed]

FINDINGS: Upright frontal, lateral, tunnel, and sunrise patellar images were
obtained. No fracture or dislocation. No joint effusion. Joint
spaces appear unremarkable. No erosive change.
IMPRESSION: No appreciable fracture or joint effusion. No appreciable underlying
arthropathy.

## 2019-01-10 ENCOUNTER — Other Ambulatory Visit: Payer: Self-pay | Admitting: Internal Medicine

## 2019-01-10 DIAGNOSIS — K219 Gastro-esophageal reflux disease without esophagitis: Secondary | ICD-10-CM

## 2019-03-28 ENCOUNTER — Ambulatory Visit (INDEPENDENT_AMBULATORY_CARE_PROVIDER_SITE_OTHER): Payer: No Typology Code available for payment source | Admitting: Internal Medicine

## 2019-03-28 ENCOUNTER — Other Ambulatory Visit: Payer: Self-pay

## 2019-03-28 ENCOUNTER — Encounter: Payer: Self-pay | Admitting: Internal Medicine

## 2019-03-28 VITALS — BP 128/78 | HR 77 | Ht <= 58 in | Wt 162.0 lb

## 2019-03-28 DIAGNOSIS — D485 Neoplasm of uncertain behavior of skin: Secondary | ICD-10-CM | POA: Diagnosis not present

## 2019-03-28 DIAGNOSIS — K439 Ventral hernia without obstruction or gangrene: Secondary | ICD-10-CM

## 2019-03-28 DIAGNOSIS — K219 Gastro-esophageal reflux disease without esophagitis: Secondary | ICD-10-CM

## 2019-03-28 DIAGNOSIS — K5909 Other constipation: Secondary | ICD-10-CM

## 2019-03-28 MED ORDER — TRULANCE 3 MG PO TABS: 1.0000 | ORAL_TABLET | Freq: Every day | ORAL | 3 refills | Status: DC

## 2019-03-28 NOTE — Progress Notes (Signed)
Date:  03/28/2019   Name:  Carolyn Barnes   DOB:  June 20, 1986   MRN:  [id number]   Chief Complaint: Hernia (Last time here she felt lump above belly button. Feeling it now. PA at her office pushed it back in but it was painful. Only hurts when eating or having BMS.)  Gastroesophageal Reflux She complains of abdominal pain and heartburn. She reports no chest pain, no coughing or no wheezing. This is a recurrent problem. The problem occurs occasionally. Pertinent negatives include no fatigue. She has tried a PPI for the symptoms.  Abdominal Pain This is a new problem. The current episode started in the past 7 days. The problem has been resolved. The pain is located in the periumbilical region. The pain is moderate. The quality of the pain is cramping. The abdominal pain does not radiate. Associated symptoms include constipation. Pertinent negatives include no fever or headaches. Prior diagnostic workup includes upper endoscopy (has a HH but PA at work reduced a ventral hernia). Her past medical history is significant for GERD.  Constipation This is a chronic problem. The stool is described as firm. The patient is not on a high fiber diet. There has been adequate water intake. Associated symptoms include abdominal pain. Pertinent negatives include no fever. Treatments tried: multiple meds in the past - did well on samples of Trulance.  Numerous moles - she needs a referral to Dermatology.  Review of Systems  Constitutional: Negative for chills, fatigue and fever.  Respiratory: Negative for cough, chest tightness, shortness of breath and wheezing.   Cardiovascular: Negative for chest pain, palpitations and leg swelling.  Gastrointestinal: Positive for abdominal pain, constipation and heartburn. Negative for blood in stool.  Skin:       Various moles   Neurological: Negative for dizziness and headaches.  Psychiatric/Behavioral: Negative for dysphoric mood and sleep disturbance. The  patient is not nervous/anxious.     Patient Active Problem List   Diagnosis Date Noted  . Bilateral pes planus 05/01/2018  . Vitamin D deficiency 03/17/2018  . Knee pain 03/17/2018  . Major depressive disorder with single episode, in full remission ([place]) 03/17/2018  . Headache, migraine 09/02/2015  . Allergic rhinitis 09/02/2015  . Acid reflux 09/02/2015  . Chronic constipation 03/07/2015  . Short stature 03/07/2015    No Known Allergies  Past Surgical History:  Procedure Laterality Date  . DILATION AND CURETTAGE OF UTERUS  02/2013,10/2013    Social History   Tobacco Use  . Smoking status: Never Smoker  . Smokeless tobacco: Never Used  Substance Use Topics  . Alcohol use: No  . Drug use: No     Medication list has been reviewed and updated.  Current Meds  Medication Sig  . Biotin 10 MG CAPS Take 1 tablet by mouth 1 day or 1 dose.  . Calcium Carb-Cholecalciferol (CALCIUM 1000 + D PO)   . fluticasone (FLONASE) 50 MCG/ACT nasal spray Place 2 sprays into both nostrils daily.  . pantoprazole (PROTONIX) 20 MG tablet TAKE 1 TABLET BY MOUTH DAILY    PHQ 2/9 Scores 03/28/2019 11/14/2018 05/01/2018 03/17/2018  PHQ - 2 Score 0 0 0 0  PHQ- 9 Score 2 - 4 -    BP Readings from Last 3 Encounters:  03/28/19 128/78  11/20/18 120/80  11/14/18 114/68    Physical Exam Vitals signs and nursing note reviewed.  Constitutional:      General: She is not in acute distress.    Appearance: She is  well-developed.  HENT:     Head: Normocephalic and atraumatic.  Cardiovascular:     Rate and Rhythm: Normal rate and regular rhythm.  Pulmonary:     Effort: Pulmonary effort is normal. No respiratory distress.     Breath sounds: No wheezing or rhonchi.  Abdominal:    Musculoskeletal: Normal range of motion.  Skin:    General: Skin is warm and dry.     Capillary Refill: Capillary refill takes less than 2 seconds.     Findings: No rash.  Neurological:     Mental Status: She is alert and  oriented to person, place, and time.  Psychiatric:        Behavior: Behavior normal.        Thought Content: Thought content normal.     Wt Readings from Last 3 Encounters:  03/28/19 162 lb (73.5 kg)  11/20/18 152 lb (68.9 kg)  11/14/18 150 lb (68 kg)    BP 128/78   Pulse 77   Ht 4\' 10"  (1.473 m)   Wt 162 lb (73.5 kg)   LMP 03/07/2019   SpO2 98%   BMI 33.86 kg/m   Assessment and Plan: 1. Ventral hernia without obstruction or gangrene Not appreciated today - Ambulatory referral to General Surgery  2. Chronic constipation May need to do a PA - Plecanatide (TRULANCE) 3 MG TABS; Take 1 tablet by mouth daily at 2 PM.  Dispense: 90 tablet; Refill: 3  3. Neoplasm of uncertain behavior of skin Pt requests Dr. Phillip Heal - Ambulatory referral to Dermatology  4. Gastroesophageal reflux disease, esophagitis presence not specified Continue Pantoprazole   Partially dictated using Editor, commissioning. Any errors are unintentional.  Halina Maidens, MD [facility]  03/28/2019

## 2019-04-05 ENCOUNTER — Telehealth: Payer: Self-pay

## 2019-04-05 NOTE — Telephone Encounter (Signed)
Completed PA on [email or web address] for Trulance 3 MG tabs.  Awaiting response from insurance for this med.  Gordy Clement Key: [id number] -  PA Case ID: [id number]

## 2019-04-09 ENCOUNTER — Ambulatory Visit: Payer: No Typology Code available for payment source | Admitting: Surgery

## 2019-04-09 NOTE — Telephone Encounter (Signed)
Patient medication approved for 12 fills from 04/09/2019 to 04/07/2020. Sent patient my chart message and informed.

## 2019-04-25 ENCOUNTER — Ambulatory Visit: Payer: No Typology Code available for payment source | Admitting: Surgery

## 2019-05-09 ENCOUNTER — Encounter: Payer: Self-pay | Admitting: Surgery

## 2019-05-09 ENCOUNTER — Ambulatory Visit: Payer: No Typology Code available for payment source | Admitting: Surgery

## 2019-05-09 ENCOUNTER — Other Ambulatory Visit: Payer: Self-pay

## 2019-05-09 VITALS — BP 121/85 | HR 66 | Temp 97.9°F | Resp 16 | Ht <= 58 in | Wt 164.6 lb

## 2019-05-09 DIAGNOSIS — K439 Ventral hernia without obstruction or gangrene: Secondary | ICD-10-CM | POA: Diagnosis not present

## 2019-05-09 NOTE — Patient Instructions (Addendum)
Angie our surgery scheduler will call you to confirm your surgery.  Ventral Hernia  A ventral hernia is a bulge of tissue from inside the abdomen that pushes through a weak area of the muscles that form the front wall of the abdomen. The tissues inside the abdomen are inside a sac (peritoneum). These tissues include the small intestine, large intestine, and the fatty tissue that covers the intestines (omentum). Sometimes, the bulge that forms a hernia contains intestines. Other hernias contain only fat. Ventral hernias do not go away without surgical treatment. There are several types of ventral hernias. You may have:  A hernia at an incision site from previous abdominal surgery (incisional hernia).  A hernia just above the belly button (epigastric hernia), or at the belly button (umbilical hernia). These types of hernias can develop from heavy lifting or straining.  A hernia that comes and goes (reducible hernia). It may be visible only when you lift or strain. This type of hernia can be pushed back into the abdomen (reduced).  A hernia that traps abdominal tissue inside the hernia (incarcerated hernia). This type of hernia does not reduce.  A hernia that cuts off blood flow to the tissues inside the hernia (strangulated hernia). The tissues can start to die if this happens. This is a very painful bulge that cannot be reduced. A strangulated hernia is a medical emergency. What are the causes? This condition is caused by abdominal tissue putting pressure on an area of weakness in the abdominal muscles. What increases the risk? The following factors may make you more likely to develop this condition:  Being female.  Being 36 or older.  Being overweight or obese.  Having had previous abdominal surgery, especially if there was an infection after surgery.  Having had an injury to the abdominal wall.  Having had several pregnancies.  Having a buildup of fluid inside the abdomen (ascites).  What are the signs or symptoms? The only symptom of a ventral hernia may be a painless bulge in the abdomen. A reducible hernia may be visible only when you strain, cough, or lift. Other symptoms may include:  Dull pain.  A feeling of pressure. Signs and symptoms of a strangulated hernia may include:  Increasing pain.  Nausea and vomiting.  Pain when pressing on the hernia.  The skin over the hernia turning red or purple.  Constipation.  Blood in the stool (feces). How is this diagnosed? This condition may be diagnosed based on:  Your symptoms.  Your medical history.  A physical exam. You may be asked to cough or strain while standing. These actions increase the pressure inside your abdomen and force the hernia through the opening in your muscles. Your health care provider may try to reduce the hernia by pressing on it.  Imaging studies, such as an ultrasound or CT scan. How is this treated? This condition is treated with surgery. If you have a strangulated hernia, surgery is done as soon as possible. If your hernia is small and not incarcerated, you may be asked to lose some weight before surgery. Follow these instructions at home:  Follow instructions from your health care provider about eating or drinking restrictions.  If you are overweight, your health care provider may recommend that you increase your activity level and eat a healthier diet.  Do not lift anything that is heavier than 10 lb (4.5 kg).  Return to your normal activities as told by your health care provider. Ask your health care provider  what activities are safe for you. You may need to avoid activities that increase pressure on your hernia.  Take over-the-counter and prescription medicines only as told by your health care provider.  Keep all follow-up visits as told by your health care provider. This is important. Contact a health care provider if:  Your hernia gets larger.  Your hernia becomes  painful. Get help right away if:  Your hernia becomes increasingly painful.  You have pain along with any of the following: ? Changes in skin color in the area of the hernia. ? Nausea. ? Vomiting. ? Fever. Summary  A ventral hernia is a bulge of tissue from inside the abdomen that pushes through a weak area of the muscles that form the front wall of the abdomen.  This condition is treated with surgery, which may be urgent depending on your hernia.  Do not lift anything that is heavier than 10 lb (4.5 kg), and follow activity instructions from your health care provider. This information is not intended to replace advice given to you by your health care provider. Make sure you discuss any questions you have with your health care provider. Document Released: 08/16/2012 Document Revised: 10/12/2017 Document Reviewed: 03/21/2017 Elsevier Patient Education  2020 [employer].

## 2019-05-10 NOTE — Progress Notes (Signed)
Patient ID: Carolyn Barnes, female   DOB: 1986-05-27, 33 y.o.   MRN: [id number]  HPI Carolyn Barnes is a 33 y.o. female seen in consultation at the request of Dr. Bernardo Heater from radiology.  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  She reports her last few weeks she has noticed a tender bulge within the abdominal wall.  Reports intermittent moderate pain that is sharp and worsening with Valsalva.  There is no evidence of incarceration or strangulation.  She noticed that she has had this hernia for a while but only recently became symptomatic.  Denies any fevers any chills no nausea no vomiting.  No previous major abdominal operations.  She is able to perform more than 4 METS of activity without any shortness of breath or chest pain.  BMP and CBC is completely normal. SHe denies any smoking history  HPI  Past Medical History:  Diagnosis Date  . Anxiety   . Fatigue   . Indigestion   . Migraine headache   . Seasonal allergies   . Vitamin D deficiency disease     Past Surgical History:  Procedure Laterality Date  . DILATION AND CURETTAGE OF UTERUS  02/2013,10/2013  . ESOPHAGOGASTRODUODENOSCOPY  02/2014   Dr Allen Norris - small HH and gastritis    Family History  Problem Relation Age of Onset  . Heart disease Maternal Grandmother   . Diabetes Maternal Grandmother   . Healthy Mother     Social History Social History   Tobacco Use  . Smoking status: Never Smoker  . Smokeless tobacco: Never Used  Substance Use Topics  . Alcohol use: No  . Drug use: No    No Known Allergies  Current Outpatient Medications  Medication Sig Dispense Refill  . Biotin 10 MG CAPS Take 1 tablet by mouth 1 day or 1 dose.    . fluticasone (FLONASE) 50 MCG/ACT nasal spray Place 2 sprays into both nostrils daily. 16 g 0  . pantoprazole (PROTONIX) 20 MG tablet TAKE 1 TABLET BY MOUTH DAILY 90 tablet 1  . Plecanatide (TRULANCE) 3 MG TABS Take 1 tablet by mouth daily at 2 PM. 90 tablet 3  . Calcium Carb-Cholecalciferol  (CALCIUM 1000 + D PO)      No current facility-administered medications for this visit.      Review of Systems Full ROS  was asked and was negative except for the information on the HPI  Physical Exam Blood pressure 121/85, pulse 66, temperature 97.9 F (36.6 C), temperature source Temporal, resp. rate 16, height 4' 9.5" (1.461 m), weight 164 lb 9.6 oz (74.7 kg), last menstrual period 05/01/2019, SpO2 98 %. CONSTITUTIONAL:NAD EYES: Pupils are equal, round, and reactive to light, Sclera are non-icteric. EARS, NOSE, MOUTH AND THROAT: The oropharynx is clear. The oral mucosa is pink and moist. Hearing is intact to voice. LYMPH NODES:  Lymph nodes in the neck are normal. RESPIRATORY:  Lungs are clear. There is normal respiratory effort, with equal breath sounds bilaterally, and without pathologic use of accessory muscles. CARDIOVASCULAR: Heart is regular without murmurs, gallops, or rubs. GI: The abdomen is soft, there is evidence of a 2 to 3 cm epigastric ventral hernia.  It is tender to palpation.  No peritonitis.  No masses no rebound.  GU: Rectal deferred.   MUSCULOSKELETAL: Normal muscle strength and tone. No cyanosis or edema.   SKIN: Turgor is good and there are no pathologic skin lesions or ulcers. NEUROLOGIC: Motor and sensation is grossly normal. Cranial nerves are grossly  intact. PSYCH:  Oriented to person, place and time. Affect is normal.  Data Reviewed  I have personally reviewed the patient's imaging, laboratory findings and medical records.    Assessment/Plan 33 year old female with a symptomatic epigastric ventral hernia.  Discussed with the patient in detail about her disease process given her symptoms I do recommend repair.  I do think that she is a great candidate for robotic repair.  Procedure discussed with the patient in detail.  Risk, benefits and possible complications including but not limited to: Bleeding, infection, bowel injuries, chronic pain and mesh issues.   She understands and wishes to proceed.  We discussed with her in detail about postoperative outcomes and expectations.  We will place her in the schedule at her convenience.  Copy of this report was sent to the referring provider  [name], MD FACS General Surgeon 05/10/2019, 12:23 PM

## 2019-05-11 ENCOUNTER — Encounter: Payer: No Typology Code available for payment source | Admitting: Internal Medicine

## 2019-05-28 ENCOUNTER — Other Ambulatory Visit: Payer: Self-pay

## 2019-05-28 ENCOUNTER — Encounter: Payer: Self-pay | Admitting: Internal Medicine

## 2019-05-28 ENCOUNTER — Ambulatory Visit (INDEPENDENT_AMBULATORY_CARE_PROVIDER_SITE_OTHER): Payer: No Typology Code available for payment source | Admitting: Certified Nurse Midwife

## 2019-05-28 ENCOUNTER — Encounter: Payer: Self-pay | Admitting: Certified Nurse Midwife

## 2019-05-28 ENCOUNTER — Other Ambulatory Visit: Payer: Self-pay | Admitting: Internal Medicine

## 2019-05-28 VITALS — BP 107/78 | HR 77 | Ht <= 58 in | Wt 161.6 lb

## 2019-05-28 DIAGNOSIS — F325 Major depressive disorder, single episode, in full remission: Secondary | ICD-10-CM

## 2019-05-28 DIAGNOSIS — Z30430 Encounter for insertion of intrauterine contraceptive device: Secondary | ICD-10-CM

## 2019-05-28 MED ORDER — ESCITALOPRAM OXALATE 10 MG PO TABS: 10.0000 mg | ORAL_TABLET | Freq: Every day | ORAL | 0 refills | Status: DC

## 2019-05-28 NOTE — Progress Notes (Signed)
[name] Carolyn Barnes is a 33 y.o. year old G18P2022 African American female who presents for placement of a Mirena IUD.  BP 107/78   Pulse 77   Ht 4' 9.5" (1.461 m)   Wt 161 lb 9.6 oz (73.3 kg)   LMP 05/28/2019 (Exact Date)   BMI 34.36 kg/m   The risks and benefits of the method and placement have been thouroughly reviewed with the patient and all questions were answered.  Specifically the patient is aware of failure rate of 09/998, expulsion of the IUD and of possible perforation.  The patient is aware of irregular bleeding due to the method and understands the incidence of irregular bleeding diminishes with time.  Signed copy of informed consent in chart.   Time out was performed.  A medium plastic speculum was placed in the vagina.  The cervix was visualized, prepped using Betadine, and grasped with a single tooth tenaculum. The uterus was found to be retroflexed and it sounded to 7 cm.  Mirena IUD placed per manufacturer's recommendations.   The strings were trimmed to 3 cm.  The patient was given post procedure instructions, including signs and symptoms of infection and to check for the strings after each menses or each month, and refraining from intercourse or anything in the vagina for 3 days.  She was given a Mirena care card with date Mirena placed, and date Mirena to be removed.  Reviewed red flag symptoms and when to call.   RTC x 6-8 weeks for IUD string check.    Diona Fanti, CNM [facility], [facility] 05/28/19 5:31 PM   Leith: [id number] Lot: [id number] Exp: 07/2021

## 2019-05-28 NOTE — Patient Instructions (Signed)
IUD PLACEMENT POST-PROCEDURE INSTRUCTIONS  1. You may take Ibuprofen, Aleve or Tylenol for pain if needed.  Cramping should resolve within in 24 hours.  2. You may have a small amount of spotting.  You should wear a mini pad for the next few days.  3. You may have intercourse after 24 hours.  If you using this for birth control, it is effective immediately.  4. You need to call if you have any pelvic pain, fever, heavy bleeding or foul smelling vaginal discharge.  Irregular bleeding is common the first several months after having an IUD placed. You do not need to call for this reason unless you are concerned.  5. Shower or bathe as normal  You should have a follow-up appointment in 4-8 weeks for a re-check to make sure you are not having any problems.  Levonorgestrel intrauterine device (IUD) What is this medicine? LEVONORGESTREL IUD (LEE voe nor jes trel) is a contraceptive (birth control) device. The device is placed inside the uterus by a healthcare professional. It is used to prevent pregnancy. This device can also be used to treat heavy bleeding that occurs during your period. This medicine may be used for other purposes; ask your health care provider or pharmacist if you have questions. COMMON BRAND NAME(S): Minette Headland What should I tell my health care provider before I take this medicine? They need to know if you have any of these conditions:  abnormal Pap smear  cancer of the breast, uterus, or cervix  diabetes  endometritis  genital or pelvic infection now or in the past  have more than one sexual partner or your partner has more than one partner  heart disease  history of an ectopic or tubal pregnancy  immune system problems  IUD in place  liver disease or tumor  problems with blood clots or take blood-thinners  seizures  use intravenous drugs  uterus of unusual shape  vaginal bleeding that has not been explained  an unusual or  allergic reaction to levonorgestrel, other hormones, silicone, or polyethylene, medicines, foods, dyes, or preservatives  pregnant or trying to get pregnant  breast-feeding How should I use this medicine? This device is placed inside the uterus by a health care professional. Talk to your pediatrician regarding the use of this medicine in children. Special care may be needed. Overdosage: If you think you have taken too much of this medicine contact a poison control center or emergency room at once. NOTE: This medicine is only for you. Do not share this medicine with others. What if I miss a dose? This does not apply. Depending on the brand of device you have inserted, the device will need to be replaced every 3 to 6 years if you wish to continue using this type of birth control. What may interact with this medicine? Do not take this medicine with any of the following medications:  amprenavir  bosentan  fosamprenavir This medicine may also interact with the following medications:  aprepitant  armodafinil  barbiturate medicines for inducing sleep or treating seizures  bexarotene  boceprevir  griseofulvin  medicines to treat seizures like carbamazepine, ethotoin, felbamate, oxcarbazepine, phenytoin, topiramate  modafinil  pioglitazone  rifabutin  rifampin  rifapentine  some medicines to treat HIV infection like atazanavir, efavirenz, indinavir, lopinavir, nelfinavir, tipranavir, ritonavir  St. John's wort  warfarin This list may not describe all possible interactions. Give your health care provider a list of all the medicines, herbs, non-prescription drugs, or dietary supplements  you use. Also tell them if you smoke, drink alcohol, or use illegal drugs. Some items may interact with your medicine. What should I watch for while using this medicine? Visit your doctor or health care professional for regular check ups. See your doctor if you or your partner has sexual  contact with others, becomes HIV positive, or gets a sexual transmitted disease. This product does not protect you against HIV infection (AIDS) or other sexually transmitted diseases. You can check the placement of the IUD yourself by reaching up to the top of your vagina with clean fingers to feel the threads. Do not pull on the threads. It is a good habit to check placement after each menstrual period. Call your doctor right away if you feel more of the IUD than just the threads or if you cannot feel the threads at all. The IUD may come out by itself. You may become pregnant if the device comes out. If you notice that the IUD has come out use a backup birth control method like condoms and call your health care provider. Using tampons will not change the position of the IUD and are okay to use during your period. This IUD can be safely scanned with magnetic resonance imaging (MRI) only under specific conditions. Before you have an MRI, tell your healthcare provider that you have an IUD in place, and which type of IUD you have in place. What side effects may I notice from receiving this medicine? Side effects that you should report to your doctor or health care professional as soon as possible:  allergic reactions like skin rash, itching or hives, swelling of the face, lips, or tongue  fever, flu-like symptoms  genital sores  high blood pressure  no menstrual period for 6 weeks during use  pain, swelling, warmth in the leg  pelvic pain or tenderness  severe or sudden headache  signs of pregnancy  stomach cramping  sudden shortness of breath  trouble with balance, talking, or walking  unusual vaginal bleeding, discharge  yellowing of the eyes or skin Side effects that usually do not require medical attention (report to your doctor or health care professional if they continue or are bothersome):  acne  breast pain  change in sex drive or performance  changes in  weight  cramping, dizziness, or faintness while the device is being inserted  headache  irregular menstrual bleeding within first 3 to 6 months of use  nausea This list may not describe all possible side effects. Call your doctor for medical advice about side effects. You may report side effects to FDA at 1-800-FDA-1088. Where should I keep my medicine? This does not apply. NOTE: This sheet is a summary. It may not cover all possible information. If you have questions about this medicine, talk to your doctor, pharmacist, or health care provider.  2020 Elsevier/Gold Standard (2018-07-11 13:22:01)

## 2019-05-28 NOTE — Progress Notes (Signed)
Patient here for Mirena IUD insertion.  

## 2019-05-29 ENCOUNTER — Other Ambulatory Visit: Payer: No Typology Code available for payment source

## 2019-06-04 ENCOUNTER — Other Ambulatory Visit: Payer: No Typology Code available for payment source

## 2019-06-29 ENCOUNTER — Ambulatory Visit: Payer: No Typology Code available for payment source | Admitting: Internal Medicine

## 2019-07-05 ENCOUNTER — Other Ambulatory Visit: Payer: Self-pay

## 2019-07-05 DIAGNOSIS — Z20822 Contact with and (suspected) exposure to covid-19: Secondary | ICD-10-CM

## 2019-07-06 ENCOUNTER — Telehealth: Payer: Self-pay

## 2019-07-06 NOTE — Telephone Encounter (Addendum)
Message left for patient to call back for surgery information:   Surgery Date: 09/04/19 Preadmission Testing Date:  Covid Testing Date:  - patient advised to go to the [facility] ([place]  [employer] Video sent via [employer] [facility] and [employer].  Patient has been made aware to call [phone], between 1-3:00pm the day before surgery, to find out what time to arrive.

## 2019-07-20 ENCOUNTER — Ambulatory Visit
Admission: RE | Admit: 2019-07-20 | Discharge: 2019-07-20 | Disposition: A | Discharge status: Home / Self Care | Payer: No Typology Code available for payment source | Source: Ambulatory Visit | Attending: Internal Medicine | Admitting: Internal Medicine

## 2019-07-20 ENCOUNTER — Ambulatory Visit (INDEPENDENT_AMBULATORY_CARE_PROVIDER_SITE_OTHER): Payer: No Typology Code available for payment source | Admitting: Internal Medicine

## 2019-07-20 ENCOUNTER — Ambulatory Visit
Admission: RE | Admit: 2019-07-20 | Discharge: 2019-07-20 | Disposition: A | Discharge status: Home / Self Care | Payer: No Typology Code available for payment source | Attending: Internal Medicine | Admitting: Internal Medicine

## 2019-07-20 ENCOUNTER — Other Ambulatory Visit: Payer: Self-pay

## 2019-07-20 ENCOUNTER — Encounter: Payer: Self-pay | Admitting: Internal Medicine

## 2019-07-20 VITALS — BP 116/82 | HR 82 | Ht <= 58 in | Wt 159.0 lb

## 2019-07-20 DIAGNOSIS — G8929 Other chronic pain: Secondary | ICD-10-CM | POA: Insufficient documentation

## 2019-07-20 DIAGNOSIS — R002 Palpitations: Secondary | ICD-10-CM | POA: Diagnosis not present

## 2019-07-20 DIAGNOSIS — M542 Cervicalgia: Secondary | ICD-10-CM | POA: Insufficient documentation

## 2019-07-20 NOTE — Progress Notes (Signed)
Date:  07/20/2019   Name:  Carolyn Barnes   DOB:  30-Oct-1985   MRN:  [id number]   Chief Complaint: Neck Pain (Last 2 years but getting worse. Neck pain radiating into both shoulders and into right arm. Tingling and numbness in right arm and fingers get cold. )  Neck Pain  This is a chronic problem. The current episode started more than 1 year ago. The problem has been gradually worsening. The pain is present in the midline. The quality of the pain is described as burning and aching (radiating to right arm and hand). The pain is moderate. Associated symptoms include chest pain (chest heaviness off an on since a stressful incident a month ago) and numbness (in 4th and 5th fingers on right hand). Pertinent negatives include no fever, headaches, paresis, weakness or weight loss. She has tried heat (TENS unit) for the symptoms. The treatment provided mild relief.    Review of Systems  Constitutional: Negative for chills, fatigue, fever, unexpected weight change and weight loss.  Respiratory: Negative for chest tightness, shortness of breath and wheezing.   Cardiovascular: Positive for chest pain (chest heaviness off an on since a stressful incident a month ago). Negative for palpitations.  Musculoskeletal: Positive for arthralgias and neck pain.  Neurological: Positive for numbness (in 4th and 5th fingers on right hand). Negative for dizziness, weakness and headaches.  Hematological: Negative for adenopathy. Does not bruise/bleed easily.    Patient Active Problem List   Diagnosis Date Noted  . Bilateral pes planus 05/01/2018  . Vitamin D deficiency 03/17/2018  . Knee pain 03/17/2018  . Major depressive disorder with single episode, in full remission ([place]) 03/17/2018  . Headache, migraine 09/02/2015  . Allergic rhinitis 09/02/2015  . Hiatal hernia with GERD without esophagitis 09/02/2015  . Chronic constipation 03/07/2015  . Short stature 03/07/2015    No Known Allergies  Past  Surgical History:  Procedure Laterality Date  . DILATION AND CURETTAGE OF UTERUS  02/2013,10/2013  . ESOPHAGOGASTRODUODENOSCOPY  02/2014   Dr Allen Norris - small HH and gastritis    Social History   Tobacco Use  . Smoking status: Never Smoker  . Smokeless tobacco: Never Used  Substance Use Topics  . Alcohol use: No  . Drug use: No     Medication list has been reviewed and updated.  Current Meds  Medication Sig  . fluticasone (FLONASE) 50 MCG/ACT nasal spray Place 2 sprays into both nostrils daily.  . pantoprazole (PROTONIX) 20 MG tablet TAKE 1 TABLET BY MOUTH DAILY  . Plecanatide (TRULANCE) 3 MG TABS Take 1 tablet by mouth daily at 2 PM.    PHQ 2/9 Scores 07/20/2019 03/28/2019 11/14/2018 05/01/2018  PHQ - 2 Score 2 0 0 0  PHQ- 9 Score 6 2 - 4    BP Readings from Last 3 Encounters:  07/20/19 116/82  05/28/19 107/78  05/09/19 121/85    Physical Exam Vitals signs and nursing note reviewed.  Constitutional:      General: She is not in acute distress.    Appearance: Normal appearance. She is well-developed.  HENT:     Head: Normocephalic and atraumatic.  Neck:     Musculoskeletal: Normal range of motion.  Cardiovascular:     Rate and Rhythm: Normal rate and regular rhythm.     Pulses: Normal pulses.          Radial pulses are 2+ on the right side and 2+ on the left side.  Heart sounds: No murmur.  Pulmonary:     Effort: Pulmonary effort is normal. No respiratory distress.     Breath sounds: No wheezing or rhonchi.  Musculoskeletal: Normal range of motion.     Right elbow: She exhibits normal range of motion, no swelling and no effusion. Tenderness found. Medial epicondyle tenderness noted.     Cervical back: Normal. She exhibits normal range of motion, no tenderness, no bony tenderness and no spasm.     Right lower leg: No edema.     Left lower leg: No edema.  Lymphadenopathy:     Cervical: No cervical adenopathy.  Skin:    General: Skin is warm and dry.     Findings:  No rash.  Neurological:     Mental Status: She is alert and oriented to person, place, and time.     Cranial Nerves: Cranial nerves are intact.     Sensory: Sensation is intact.     Deep Tendon Reflexes:     Reflex Scores:      Bicep reflexes are 2+ on the right side and 2+ on the left side.      Patellar reflexes are 2+ on the right side and 2+ on the left side. Psychiatric:        Behavior: Behavior normal.        Thought Content: Thought content normal.     Wt Readings from Last 3 Encounters:  07/20/19 159 lb (72.1 kg)  05/28/19 161 lb 9.6 oz (73.3 kg)  05/09/19 164 lb 9.6 oz (74.7 kg)    BP 116/82   Pulse 82   Ht 4' 9.5" (1.461 m)   Wt 159 lb (72.1 kg)   SpO2 100%   BMI 33.81 kg/m   Assessment and Plan: 1. Neck pain, chronic With right finger numbness - intermittent Possible cubital tunnel syndrome vs Cervical DD Will get plain films and if normal, refer to PT Pt declines any anti-inflammatories/prednisone for now - DG Cervical Spine Complete; Future  2. Heart palpitations Suspect intermittent palpitations as cause of symptoms EKG essentially normal - should not be at risk for upcoming hernia surgery - EKG 12-Lead - SR @ 65, WNL   Partially dictated using Editor, commissioning. Any errors are unintentional.  Halina Maidens, MD [facility]  07/20/2019

## 2019-07-23 ENCOUNTER — Other Ambulatory Visit: Payer: Self-pay | Admitting: Internal Medicine

## 2019-07-23 DIAGNOSIS — G8929 Other chronic pain: Secondary | ICD-10-CM

## 2019-07-23 DIAGNOSIS — M542 Cervicalgia: Secondary | ICD-10-CM

## 2019-07-23 MED ORDER — CYCLOBENZAPRINE HCL 10 MG PO TABS: 10.0000 mg | ORAL_TABLET | Freq: Every day | ORAL | 0 refills | Status: DC

## 2019-07-23 NOTE — Telephone Encounter (Signed)
Please advise patient note.

## 2019-07-27 ENCOUNTER — Other Ambulatory Visit: Payer: No Typology Code available for payment source

## 2019-07-30 ENCOUNTER — Encounter: Payer: Self-pay | Admitting: Internal Medicine

## 2019-07-30 NOTE — Telephone Encounter (Signed)
Please advise. Should we refer to Cardiology?

## 2019-07-31 ENCOUNTER — Other Ambulatory Visit: Payer: Self-pay | Admitting: Internal Medicine

## 2019-07-31 DIAGNOSIS — R002 Palpitations: Secondary | ICD-10-CM

## 2019-08-03 ENCOUNTER — Other Ambulatory Visit: Payer: No Typology Code available for payment source

## 2019-08-07 ENCOUNTER — Encounter: Payer: Self-pay | Admitting: Cardiovascular Disease

## 2019-08-07 ENCOUNTER — Ambulatory Visit (INDEPENDENT_AMBULATORY_CARE_PROVIDER_SITE_OTHER): Payer: No Typology Code available for payment source

## 2019-08-07 ENCOUNTER — Other Ambulatory Visit: Payer: Self-pay

## 2019-08-07 ENCOUNTER — Ambulatory Visit (INDEPENDENT_AMBULATORY_CARE_PROVIDER_SITE_OTHER): Payer: No Typology Code available for payment source | Admitting: Cardiovascular Disease

## 2019-08-07 VITALS — BP 120/80 | HR 99 | Ht <= 58 in | Wt 163.5 lb

## 2019-08-07 DIAGNOSIS — R079 Chest pain, unspecified: Secondary | ICD-10-CM | POA: Diagnosis not present

## 2019-08-07 DIAGNOSIS — R002 Palpitations: Secondary | ICD-10-CM | POA: Diagnosis not present

## 2019-08-07 NOTE — Patient Instructions (Signed)
Medication Instructions:  Your physician recommends that you continue on your current medications as directed. Please refer to the Current Medication list given to you today.  *If you need a refill on your cardiac medications before your next appointment, please call your pharmacy*  Lab Work: Bmet, Psychologist, occupational, Tsh today If you have labs (blood work) drawn today and your tests are completely normal, you will receive your results only by: Marland Kitchen MyChart Message (if you have MyChart) OR . A paper copy in the mail If you have any lab test that is abnormal or we need to change your treatment, we will call you to review the results.  Testing/Procedures: Your physician has requested that you have an echocardiogram. Echocardiography is a painless test that uses sound waves to create images of your heart. It provides your doctor with information about the size and shape of your heart and how well your heart's chambers and valves are working. This procedure takes approximately one hour. There are no restrictions for this procedure.  Your physician has recommended that you wear a Zio monitor. This monitor is a medical device that records the heart's electrical activity. Doctors most often use these monitors to diagnose arrhythmias. Arrhythmias are problems with the speed or rhythm of the heartbeat. The monitor is a small device applied to your chest. You can wear one while you do your normal daily activities. While wearing this monitor if you have any symptoms to push the button and record what you felt. Once you have worn this monitor for the period of time provider prescribed (Usually 14 days), you will return the monitor device in the postage paid box. Once it is returned they will download the data collected and provide [place] with a report which the provider will then review and we will call you with those results. Important tips:  1. Avoid showering during the first 24 hours of wearing the monitor. 2. Avoid excessive  sweating to help maximize wear time. 3. Do not submerge the device, no hot tubs, and no swimming pools. 4. Keep any lotions or oils away from the patch. 5. After 24 hours you may shower with the patch on. Take brief showers with your back facing the shower head.  6. Do not remove patch once it has been placed because that will interrupt data and decrease adhesive wear time. 7. Push the button when you have any symptoms and write down what you were feeling. 8. Once you have completed wearing your monitor, remove and place into box which has postage paid and place in your outgoing mailbox.  9. If for some reason you have misplaced your box then call our office and we can provide another box and/or mail it off for you.       Follow-Up: At [facility], you and your health needs are our priority.  As part of our continuing mission to provide you with exceptional heart care, we have created designated Provider Care Teams.  These Care Teams include your primary Cardiologist (physician) and Advanced Practice Providers (APPs -  Physician Assistants and Nurse Practitioners) who all work together to provide you with the care you need, when you need it.  Your next appointment:   As needed   The format for your next appointment:   In Person  Provider:    You may see  Dr. Fletcher Anon  or one of the following Advanced Practice Providers on your designated Care Team:    Murray Hodgkins, NP  Christell Faith, PA-C  Marrianne Mood, PA-C   Other Instructions N/A

## 2019-08-07 NOTE — Progress Notes (Signed)
Cardiology Office Note   Date:  08/07/2019   ID:  [place] 08-26-1986, MRN [id number]  PCP:  Glean Hess, MD  Cardiologist:   Kathlyn Sacramento, MD   Chief Complaint  Patient presents with  . New Patient (Initial Visit)    Patient denies chest pain and SOB at this time. Meds reviewed verbally with patient.       History of Present Illness: Carolyn Barnes is a 33 y.o. female who was referred by Dr. Army Melia for evaluation of palpitations.  She has no prior cardiac history and has been overall healthy throughout her life other than mild obesity.  She has no history of hypertension, diabetes or hyperlipidemia.  She is a lifelong non-smoker.  Family history is remarkable for hypertension but there is no family history of premature coronary artery disease, arrhythmia or sudden death. Over the last month, she has developed intermittent episodes of palpitations described as fast heartbeats.  These episodes are usually associated with substernal chest tightness with no dizziness, syncope or presyncope.  On average, she gets 1 or 2 episodes a week and sometimes the tachycardia can last for few hours.  No prior history of arrhythmia and no previous cardiac work-up.    Past Medical History:  Diagnosis Date  . Anxiety   . Fatigue   . Indigestion   . Migraine headache   . Seasonal allergies   . Vitamin D deficiency disease     Past Surgical History:  Procedure Laterality Date  . DILATION AND CURETTAGE OF UTERUS  02/2013,10/2013  . ESOPHAGOGASTRODUODENOSCOPY  02/2014   Dr Allen Norris - small HH and gastritis     Current Outpatient Medications  Medication Sig Dispense Refill  . cyclobenzaprine (FLEXERIL) 10 MG tablet Take 1 tablet (10 mg total) by mouth at bedtime. 90 tablet 0  . fluticasone (FLONASE) 50 MCG/ACT nasal spray Place 2 sprays into both nostrils daily. 16 g 0  . pantoprazole (PROTONIX) 20 MG tablet TAKE 1 TABLET BY MOUTH DAILY 90 tablet 1  .  Plecanatide (TRULANCE) 3 MG TABS Take 1 tablet by mouth daily at 2 PM. 90 tablet 3   No current facility-administered medications for this visit.     Allergies:   Patient has no known allergies.    Social History:  The patient  reports that she has never smoked. She has never used smokeless tobacco. She reports that she does not drink alcohol or use drugs.   Family History:  The patient's family history includes Diabetes in her maternal grandmother; Healthy in her mother; Heart disease in her maternal grandmother.    ROS:  Please see the history of present illness.   Otherwise, review of systems are positive for none.   All other systems are reviewed and negative.    PHYSICAL EXAM: VS:  BP 120/80 (BP Location: Right Arm, Patient Position: Sitting, Cuff Size: Normal)   Pulse 99   Ht 4' 9.5" (1.461 m)   Wt 163 lb 8 oz (74.2 kg)   BMI 34.77 kg/m  , BMI Body mass index is 34.77 kg/m. GEN: Well nourished, well developed, in no acute distress  HEENT: normal  Neck: no JVD, carotid bruits, or masses Cardiac: RRR; no murmurs, rubs, or gallops,no edema  Respiratory:  clear to auscultation bilaterally, normal work of breathing GI: soft, nontender, nondistended, + BS MS: no deformity or atrophy  Skin: warm and dry, no rash Neuro:  Strength and sensation are intact Psych: euthymic mood, full  affect   EKG:  EKG is not ordered today. I reviewed recent EKG done with her primary care physician which showed sinus rhythm with inverted T waves in V1 and V2 likely normal variant.  Normal PR and QT intervals.   Recent Labs: No results found for requested labs within last 8760 hours.    Lipid Panel No results found for: CHOL, TRIG, HDL, CHOLHDL, VLDL, LDLCALC, LDLDIRECT    Wt Readings from Last 3 Encounters:  08/07/19 163 lb 8 oz (74.2 kg)  07/20/19 159 lb (72.1 kg)  05/28/19 161 lb 9.6 oz (73.3 kg)     No flowsheet data found.    ASSESSMENT AND PLAN:  1.  Palpitations and  tachycardia: We have to exclude underlying metabolic abnormalities and thus I requested routine labs including CBC, basic metabolic profile and TSH.  I recommend evaluation with a 2-week outpatient monitor.  2.  Chest pain: This is only happening when she has palpitations and tachycardia.  I requested an echocardiogram to ensure no structural heart abnormalities.  She has no risk factors for coronary artery disease and reports no exertional symptoms otherwise.    Disposition:   FU with me as needed if the above work-up is abnormal.  Signed,  Kathlyn Sacramento, MD  08/07/2019 2:32 PM    [place]

## 2019-08-08 LAB — CBC WITH DIFFERENTIAL/PLATELET
Basophils Absolute: 0.1 10*3/uL (ref 0.0–0.2)
Basos: 1 %
EOS (ABSOLUTE): 0.3 10*3/uL (ref 0.0–0.4)
Eos: 5 %
Hematocrit: 38.2 % (ref 34.0–46.6)
Hemoglobin: 13.2 g/dL (ref 11.1–15.9)
Immature Grans (Abs): 0 10*3/uL (ref 0.0–0.1)
Immature Granulocytes: 0 %
Lymphocytes Absolute: 2.1 10*3/uL (ref 0.7–3.1)
Lymphs: 34 %
MCH: 32.4 pg (ref 26.6–33.0)
MCHC: 34.6 g/dL (ref 31.5–35.7)
MCV: 94 fL (ref 79–97)
Monocytes Absolute: 0.3 10*3/uL (ref 0.1–0.9)
Monocytes: 5 %
Neutrophils Absolute: 3.3 10*3/uL (ref 1.4–7.0)
Neutrophils: 55 %
Platelets: 244 10*3/uL (ref 150–450)
RBC: 4.08 x10E6/uL (ref 3.77–5.28)
RDW: 11.3 % — ABNORMAL LOW (ref 11.7–15.4)
WBC: 6.1 10*3/uL (ref 3.4–10.8)

## 2019-08-08 LAB — BASIC METABOLIC PANEL
BUN/Creatinine Ratio: 9 (ref 9–23)
BUN: 7 mg/dL (ref 6–20)
CO2: 22 mmol/L (ref 20–29)
Calcium: 9.1 mg/dL (ref 8.7–10.2)
Chloride: 104 mmol/L (ref 96–106)
Creatinine, Ser: 0.82 mg/dL (ref 0.57–1.00)
GFR calc Af Amer: 109 mL/min/{1.73_m2} (ref >59)
GFR calc non Af Amer: 95 mL/min/{1.73_m2} (ref >59)
Glucose: 95 mg/dL (ref 65–99)
Potassium: 4 mmol/L (ref 3.5–5.2)
Sodium: 139 mmol/L (ref 134–144)

## 2019-08-08 LAB — TSH: TSH: 1.48 u[IU]/mL (ref 0.450–4.500)

## 2019-08-14 ENCOUNTER — Ambulatory Visit: Payer: No Typology Code available for payment source | Admitting: Physical Therapy

## 2019-08-22 ENCOUNTER — Encounter: Payer: No Typology Code available for payment source | Admitting: Internal Medicine

## 2019-08-23 ENCOUNTER — Other Ambulatory Visit: Payer: Self-pay

## 2019-08-23 ENCOUNTER — Ambulatory Visit (INDEPENDENT_AMBULATORY_CARE_PROVIDER_SITE_OTHER): Payer: No Typology Code available for payment source

## 2019-08-23 DIAGNOSIS — R079 Chest pain, unspecified: Secondary | ICD-10-CM

## 2019-08-28 ENCOUNTER — Other Ambulatory Visit: Payer: Self-pay | Admitting: Internal Medicine

## 2019-08-28 DIAGNOSIS — K219 Gastro-esophageal reflux disease without esophagitis: Secondary | ICD-10-CM

## 2019-08-29 ENCOUNTER — Telehealth: Payer: Self-pay | Admitting: Surgery

## 2019-08-29 NOTE — Telephone Encounter (Signed)
Pt has been advised of pre admission date/time, Covid Testing date and Surgery date.  Surgery Date: 09/04/19 with Dr Kris Mouton assisted ventral hernia repair. Preadmission Testing Date: 08/30/19 between 8-1:00pm-phone interview.  Covid Testing Date: 08/31/19 - patient advised to go to the [place] ([place]  [employer] Video sent via [employer] [facility] and [employer].  Patient has been made aware to call [phone], between 1-3:00pm the day before surgery, to find out what time to arrive.

## 2019-08-30 ENCOUNTER — Encounter
Admission: RE | Admit: 2019-08-30 | Discharge: 2019-08-30 | Disposition: A | Discharge status: Home / Self Care | Payer: No Typology Code available for payment source | Source: Ambulatory Visit | Attending: Surgery | Admitting: Surgery

## 2019-08-30 ENCOUNTER — Encounter: Payer: Self-pay | Admitting: *Deleted

## 2019-08-30 ENCOUNTER — Other Ambulatory Visit: Payer: Self-pay

## 2019-08-30 DIAGNOSIS — Z01818 Encounter for other preprocedural examination: Secondary | ICD-10-CM | POA: Insufficient documentation

## 2019-08-30 DIAGNOSIS — K439 Ventral hernia without obstruction or gangrene: Secondary | ICD-10-CM | POA: Diagnosis not present

## 2019-08-30 HISTORY — DX: Personal history of other diseases of the digestive system: Z87.19

## 2019-08-30 HISTORY — DX: Gastro-esophageal reflux disease without esophagitis: K21.9

## 2019-08-30 NOTE — Pre-Procedure Instructions (Signed)
Glean Hess, MD  Physician  Specialty:  Internal Medicine     Progress Notes  Signed     Encounter Date:  07/20/2019                  Signed          Expand AllCollapse All            Expand widget buttonCollapse widget button    Show:Clear all   ManualTemplateCopied  Added by:     Glean Hess, MD   Hover for detailscustomization button                                                                                                                                                                 untitled image     Date:  07/20/2019      Name:  Carolyn Nishikawa                  DOB:  Mar 01, 1986                     MRN:  [id number]        Chief Complaint: Neck Pain (Last 2 years but getting worse. Neck pain radiating into both shoulders and into right arm. Tingling and numbness in right arm and fingers get cold. )     Neck Pain   This is a chronic problem. The current episode started more than 1 year ago. The problem has been gradually worsening. The pain is present in the midline. The quality of the pain is described as burning and aching (radiating to right arm and hand). The pain is moderate. Associated symptoms include chest pain (chest heaviness off an on since a stressful incident a month ago) and numbness (in 4th and 5th fingers on right hand). Pertinent negatives include no fever, headaches, paresis, weakness or weight loss. She has tried heat (TENS unit) for the symptoms. The treatment provided mild relief.         Review of Systems   Constitutional: Negative for chills, fatigue, fever, unexpected weight change and weight loss.   Respiratory: Negative for chest tightness, shortness of breath and wheezing.    Cardiovascular: Positive for chest pain  (chest heaviness off an on since a stressful incident a month ago). Negative for palpitations.   Musculoskeletal: Positive for arthralgias and neck pain.   Neurological: Positive for numbness (in 4th and 5th fingers on right hand). Negative for dizziness, weakness and headaches.   Hematological: Negative for adenopathy. Does not bruise/bleed easily.              Patient Active Problem List        Diagnosis   Date Noted    .  Bilateral pes planus   05/01/2018    .   Vitamin D deficiency   03/17/2018    .   Knee pain   03/17/2018    .   Major depressive disorder with single episode, in full remission ([place])   03/17/2018    .   Headache, migraine   09/02/2015    .   Allergic rhinitis   09/02/2015    .   Hiatal hernia with GERD without esophagitis   09/02/2015    .   Chronic constipation   03/07/2015    .   Short stature   03/07/2015          No Known Allergies           Past Surgical History:    Procedure   Laterality   Date    .   DILATION AND CURETTAGE OF UTERUS       02/2013,10/2013    .   ESOPHAGOGASTRODUODENOSCOPY       02/2014        Dr Allen Norris - small HH and gastritis           Social History            Tobacco Use    .   Smoking status:   Never Smoker    .   Smokeless tobacco:   Never Used    Substance Use Topics    .   Alcohol use:   No    .   Drug use:   No             Medication list has been reviewed and updated.     Active Medications                                                           PHQ 2/9 Scores   07/20/2019   03/28/2019   11/14/2018   05/01/2018    PHQ - 2 Score   2   0   0   0    PHQ- 9 Score   6   2   -   4              BP Readings from Last 3 Encounters:    07/20/19   116/82    05/28/19   107/78    05/09/19   121/85           Physical Exam  Vitals signs and nursing note reviewed.  Constitutional:       General: She is not in acute distress.    Appearance: Normal appearance. She is well-developed.  HENT:      Head: Normocephalic and atraumatic.  Neck:      Musculoskeletal: Normal range of motion.  Cardiovascular:      Rate and Rhythm: Normal rate and regular rhythm.     Pulses: Normal pulses.           Radial pulses are 2+ on the right side and 2+ on the left side.     Heart sounds: No murmur.  Pulmonary:      Effort: Pulmonary effort is normal. No respiratory distress.     Breath sounds: No wheezing or rhonchi.  Musculoskeletal: Normal range of motion.     Right elbow: She exhibits normal  range of motion, no swelling and no effusion. Tenderness found. Medial epicondyle tenderness noted.     Cervical back: Normal. She exhibits normal range of motion, no tenderness, no bony tenderness and no spasm.     Right lower leg: No edema.     Left lower leg: No edema.   Lymphadenopathy:      Cervical: No cervical adenopathy.  Skin:     General: Skin is warm and dry.     Findings: No rash.  Neurological:      Mental Status: She is alert and oriented to person, place, and time.     Cranial Nerves: Cranial nerves are intact.     Sensory: Sensation is intact.     Deep Tendon Reflexes:      Reflex Scores:       Bicep reflexes are 2+ on the right side and 2+ on the left side.       Patellar reflexes are 2+ on the right side and 2+ on the left side. Psychiatric:        Behavior: Behavior normal.        Thought Content: Thought content normal.                Wt Readings from Last 3 Encounters:    07/20/19   159 lb (72.1 kg)    05/28/19   161 lb 9.6 oz (73.3 kg)    05/09/19   164 lb 9.6 oz (74.7 kg)          BP 116/82   Pulse 82   Ht 4' 9.5" (1.461 m)   Wt 159 lb (72.1 kg)   SpO2 100%   BMI 33.81 kg/m      Assessment and Plan:  1. Neck pain,  chronic  With right finger numbness - intermittent  Possible cubital tunnel syndrome vs Cervical DD  Will get plain films and if normal, refer to PT  Pt declines any anti-inflammatories/prednisone for now  - DG Cervical Spine Complete; Future     2. Heart palpitations  Suspect intermittent palpitations as cause of symptoms  EKG essentially normal - should not be at risk for upcoming hernia surgery  - EKG 12-Lead - SR @ 65, WNL        Partially dictated using Editor, commissioning. Any errors are unintentional.     Halina Maidens, MD  [facility]     07/20/2019                         Electronically signed by Glean Hess, MD at 07/20/2019  3:42 PM             Office Visit on 07/20/2019               Detailed Report

## 2019-08-30 NOTE — Pre-Procedure Instructions (Signed)
Carolyn Hampshire, MD  Physician  Cardiology     Progress Notes  Signed     Encounter Date:  08/07/2019                  Signed          Expand AllCollapse All            Expand widget buttonCollapse widget button    Show:Clear all   ManualTemplateCopied  Added by:     Carolyn Hampshire, MD   Hover for detailscustomization button                                                                                                                                                              untitled image      Cardiology Office Note        Date:  08/07/2019      ID:  Carolyn Barnes, Carolyn Barnes [birth year], MRN [id number]     PCP:  Carolyn Hess, MD              Cardiologist:   Carolyn Sacramento, MD           Chief Complaint    Patient presents with    .   New Patient (Initial Visit)            Patient denies chest pain and SOB at this time. Meds reviewed verbally with patient.              History of Present Illness:  Carolyn Barnes is a 33 y.o. female who was referred by Carolyn Barnes for evaluation of palpitations.  She has no prior cardiac history and has been overall healthy throughout her life other than mild obesity.  She has no history of hypertension, diabetes or hyperlipidemia.  She is a lifelong non-smoker.  Family history is remarkable for hypertension but there is no family history of premature coronary artery disease, arrhythmia or sudden death.  Over the last month, she has developed intermittent episodes of palpitations described as fast heartbeats.  These episodes are usually associated with substernal chest tightness with no dizziness, syncope or presyncope.  On average, she gets 1 or 2 episodes a week and sometimes the tachycardia can  last for few hours.  No prior history of arrhythmia and no previous cardiac work-up.                Past Medical History:    Diagnosis   Date    .   Anxiety        .   Fatigue        .   Indigestion        .   Migraine headache        .  Seasonal allergies        .   Vitamin D deficiency disease                    Past Surgical History:    Procedure   Laterality   Date    .   DILATION AND CURETTAGE OF UTERUS       02/2013,10/2013    .   ESOPHAGOGASTRODUODENOSCOPY       02/2014        Dr Carolyn Barnes - small HH and gastritis                    Current Outpatient Medications    Medication   Sig   Dispense   Refill    .   cyclobenzaprine (FLEXERIL) 10 MG tablet   Take 1 tablet (10 mg total) by mouth at bedtime.   90 tablet   0    .   fluticasone (FLONASE) 50 MCG/ACT nasal spray   Place 2 sprays into both nostrils daily.   16 g   0    .   pantoprazole (PROTONIX) 20 MG tablet   TAKE 1 TABLET BY MOUTH DAILY   90 tablet   1    .   Plecanatide (TRULANCE) 3 MG TABS   Take 1 tablet by mouth daily at 2 PM.   90 tablet   3        No current facility-administered medications for this visit.           Allergies:   Patient has no known allergies.         Social History:  The patient  reports that she has never smoked. She has never used smokeless tobacco. She reports that she does not drink alcohol or use drugs.      Family History:  The patient's family history includes Diabetes in her maternal grandmother; Healthy in her mother; Heart disease in her maternal grandmother.         ROS:  Please see the history of present illness.   Otherwise, review of systems are positive for none.   All other systems are reviewed and negative.         PHYSICAL EXAM:  VS:  BP 120/80 (BP Location: Right Arm, Patient Position: Sitting, Cuff Size: Normal)   Pulse  99   Ht 4' 9.5" (1.461 m)   Wt 163 lb 8 oz (74.2 kg)   BMI 34.77 kg/m  , BMI Body mass index is 34.77 kg/m.  GEN: Well nourished, well developed, in no acute distress   HEENT: normal   Neck: no JVD, carotid bruits, or masses  Cardiac: RRR; no murmurs, rubs, or gallops,no edema   Respiratory:  clear to auscultation bilaterally, normal work of breathing  GI: soft, nontender, nondistended, + BS  MS: no deformity or atrophy   Skin: warm and dry, no rash  Neuro:  Strength and sensation are intact  Psych: euthymic mood, full affect        EKG:  EKG is not ordered today.  I reviewed recent EKG done with her primary care physician which showed sinus rhythm with inverted T waves in V1 and V2 likely normal variant.  Normal PR and QT intervals.        Recent Labs:  No results found for requested labs within last 8760 hours.         Lipid Panel   Labs (Brief)  Wt Readings from Last 3 Encounters:    08/07/19   163 lb 8 oz (74.2 kg)    07/20/19   159 lb (72.1 kg)    05/28/19   161 lb 9.6 oz (73.3 kg)          No flowsheet data found.           ASSESSMENT AND PLAN:     1.  Palpitations and tachycardia: We have to exclude underlying metabolic abnormalities and thus I requested routine labs including CBC, basic metabolic profile and TSH.  I recommend evaluation with a 2-week outpatient monitor.     2.  Chest pain: This is only happening when she has palpitations and tachycardia.  I requested an echocardiogram to ensure no structural heart abnormalities.  She has no risk factors for coronary artery disease and reports no exertional symptoms otherwise.           Disposition:   FU with me as needed if the above work-up is abnormal.     Signed,     Carolyn Sacramento, MD   08/07/2019 2:32 PM     [place]          Electronically signed by Carolyn Hampshire, MD at 08/07/2019   6:36 PM             Office Visit on 08/07/2019               Detailed Report

## 2019-08-30 NOTE — Patient Instructions (Signed)
Your procedure is scheduled on: 09-04-19 TUESDAY Report to Same Day Surgery 2nd floor medical mall [facility] Entrance-take elevator on left to 2nd floor.  Check in with surgery information desk.) To find out your arrival time please call [phone] between 1PM - 3PM on 09-03-19 MONDAY  Remember: Instructions that are not followed completely may result in serious medical risk, up to and including death, or upon the discretion of your surgeon and anesthesiologist your surgery may need to be rescheduled.    _x___ 1. Do not eat food after midnight the night before your procedure. NO GUM OR CANDY AFTER MIDNIGHT. You may drink clear liquids up to 2 hours before you are scheduled to arrive at the hospital for your procedure.  Do not drink clear liquids within 2 hours of your scheduled arrival to the hospital.  Clear liquids include  --Water or Apple juice without pulp  --Gatorade  --Black Coffee or Clear Tea (No milk, no creamers, do not add anything to the coffee or Tea Type 1 and type 2 diabetics should only drink water.   ____Ensure clear carbohydrate drink on the way to the hospital for bariatric patients  ____Ensure clear carbohydrate drink 3 hours before surgery.     __x__ 2. No Alcohol for 24 hours before or after surgery.   __x__3. No Smoking or e-cigarettes for 24 prior to surgery.  Do not use any chewable tobacco products for at least 6 hour prior to surgery   ____  4. Bring all medications with you on the day of surgery if instructed.    __x__ 5. Notify your doctor if there is any change in your medical condition     (cold, fever, infections).    x___6. On the morning of surgery brush your teeth with toothpaste and water.  You may rinse your mouth with mouth wash if you wish.  Do not swallow any toothpaste or mouthwash.   Do not wear jewelry, make-up, hairpins, clips or nail polish.  Do not wear lotions, powders, or perfumes.  Do not shave 48 hours prior to surgery. Men  may shave face and neck.  Do not bring valuables to the hospital.    [facility] is not responsible for any belongings or valuables.               Contacts, dentures or bridgework may not be worn into surgery.  Leave your suitcase in the car. After surgery it may be brought to your room.  For patients admitted to the hospital, discharge time is determined by your treatment team.  _  Patients discharged the day of surgery will not be allowed to drive home.  You will need someone to drive you home and stay with you the night of your procedure.    Please read over the following fact sheets that you were given:   [facility] Preparing for Surgery   _x___ TAKE THE FOLLOWING MEDICATION THE MORNING OF SURGERY WITH A SMALL SIP OF WATER. These include:  1. PROTONIX (PANTOPRAZOLE)  2. TAKE AN EXTRA PROTONIX THE NIGHT BEFORE YOUR SURGERY  3.  4.  5.  6.  ____Fleets enema or Magnesium Citrate as directed.   _x___ Use CHG Soap or sage wipes as directed on instruction sheet   ____ Use inhalers on the day of surgery and bring to hospital day of surgery  ____ Stop Metformin and Janumet 2 days prior to surgery.    ____ Take 1/2 of usual insulin dose the night  before surgery and none on the morning surgery.   ____ Follow recommendations from Cardiologist, Pulmonologist or PCP regarding  stopping Aspirin, Coumadin, Plavix ,Eliquis, Effient, or Pradaxa, and Pletal.  X____Stop Anti-inflammatories such as Advil, Aleve, Ibuprofen, Motrin, Naproxen, Naprosyn, Goodies powders or aspirin products NOW- OK to take Tylenol    ____ Stop supplements until after surgery.    ____ Bring C-Pap to the hospital.

## 2019-08-31 ENCOUNTER — Other Ambulatory Visit
Admission: RE | Admit: 2019-08-31 | Discharge: 2019-08-31 | Disposition: A | Discharge status: Home / Self Care | Payer: No Typology Code available for payment source | Source: Ambulatory Visit | Attending: Surgery | Admitting: Surgery

## 2019-08-31 DIAGNOSIS — Z20828 Contact with and (suspected) exposure to other viral communicable diseases: Secondary | ICD-10-CM | POA: Diagnosis not present

## 2019-08-31 DIAGNOSIS — Z01812 Encounter for preprocedural laboratory examination: Secondary | ICD-10-CM | POA: Diagnosis not present

## 2019-08-31 LAB — SARS CORONAVIRUS 2 (TAT 6-24 HRS): SARS Coronavirus 2: NEGATIVE

## 2019-09-04 ENCOUNTER — Other Ambulatory Visit: Payer: Self-pay

## 2019-09-04 ENCOUNTER — Encounter
Admission: RE | Disposition: A | Discharge status: Home / Self Care | Payer: Self-pay | Source: Home / Self Care | Attending: Surgery

## 2019-09-04 ENCOUNTER — Ambulatory Visit: Payer: No Typology Code available for payment source | Admitting: Certified Registered"

## 2019-09-04 ENCOUNTER — Ambulatory Visit
Admission: RE | Admit: 2019-09-04 | Discharge: 2019-09-04 | Disposition: A | Discharge status: Home / Self Care | Payer: No Typology Code available for payment source | Attending: Surgery | Admitting: Surgery

## 2019-09-04 ENCOUNTER — Encounter: Payer: Self-pay | Admitting: Surgery

## 2019-09-04 DIAGNOSIS — K449 Diaphragmatic hernia without obstruction or gangrene: Secondary | ICD-10-CM | POA: Insufficient documentation

## 2019-09-04 DIAGNOSIS — F329 Major depressive disorder, single episode, unspecified: Secondary | ICD-10-CM | POA: Insufficient documentation

## 2019-09-04 DIAGNOSIS — K429 Umbilical hernia without obstruction or gangrene: Secondary | ICD-10-CM | POA: Insufficient documentation

## 2019-09-04 DIAGNOSIS — G709 Myoneural disorder, unspecified: Secondary | ICD-10-CM | POA: Insufficient documentation

## 2019-09-04 DIAGNOSIS — E559 Vitamin D deficiency, unspecified: Secondary | ICD-10-CM | POA: Diagnosis not present

## 2019-09-04 DIAGNOSIS — K439 Ventral hernia without obstruction or gangrene: Secondary | ICD-10-CM | POA: Diagnosis not present

## 2019-09-04 DIAGNOSIS — Z833 Family history of diabetes mellitus: Secondary | ICD-10-CM | POA: Insufficient documentation

## 2019-09-04 DIAGNOSIS — Z8249 Family history of ischemic heart disease and other diseases of the circulatory system: Secondary | ICD-10-CM | POA: Diagnosis not present

## 2019-09-04 DIAGNOSIS — Z79899 Other long term (current) drug therapy: Secondary | ICD-10-CM | POA: Insufficient documentation

## 2019-09-04 DIAGNOSIS — K219 Gastro-esophageal reflux disease without esophagitis: Secondary | ICD-10-CM | POA: Insufficient documentation

## 2019-09-04 DIAGNOSIS — F419 Anxiety disorder, unspecified: Secondary | ICD-10-CM | POA: Insufficient documentation

## 2019-09-04 DIAGNOSIS — G43909 Migraine, unspecified, not intractable, without status migrainosus: Secondary | ICD-10-CM | POA: Insufficient documentation

## 2019-09-04 HISTORY — PX: XI ROBOTIC ASSISTED VENTRAL HERNIA: SHX6789

## 2019-09-04 LAB — POCT PREGNANCY, URINE: Preg Test, Ur: NEGATIVE

## 2019-09-04 SURGERY — REPAIR, HERNIA, VENTRAL, ROBOT-ASSISTED
Anesthesia: General

## 2019-09-04 MED ORDER — EPINEPHRINE PF 1 MG/ML IJ SOLN
INTRAMUSCULAR | Status: AC
  Filled 2019-09-04: qty 1

## 2019-09-04 MED ORDER — DEXAMETHASONE SODIUM PHOSPHATE 10 MG/ML IJ SOLN
INTRAMUSCULAR | Status: DC | PRN
  Administered 2019-09-04: 10 mg via INTRAVENOUS

## 2019-09-04 MED ORDER — FENTANYL CITRATE (PF) 100 MCG/2ML IJ SOLN
INTRAMUSCULAR | Status: AC
  Administered 2019-09-04: 25 ug via INTRAVENOUS
  Filled 2019-09-04: qty 2

## 2019-09-04 MED ORDER — CEFAZOLIN SODIUM-DEXTROSE 2-4 GM/100ML-% IV SOLN
2.0000 g | INTRAVENOUS | Status: AC
  Administered 2019-09-04: 2 g via INTRAVENOUS

## 2019-09-04 MED ORDER — ROCURONIUM BROMIDE 50 MG/5ML IV SOLN
INTRAVENOUS | Status: AC
  Filled 2019-09-04: qty 1

## 2019-09-04 MED ORDER — BUPIVACAINE-EPINEPHRINE 0.25% -1:200000 IJ SOLN
INTRAMUSCULAR | Status: DC | PRN
  Administered 2019-09-04: 30 mL

## 2019-09-04 MED ORDER — HYDROCODONE-ACETAMINOPHEN 5-325 MG PO TABS: 1.0000 | ORAL_TABLET | ORAL | 0 refills | Status: DC | PRN

## 2019-09-04 MED ORDER — CYCLOBENZAPRINE HCL 10 MG PO TABS
10.0000 mg | ORAL_TABLET | Freq: Once | ORAL | Status: AC
  Administered 2019-09-04: 10:00:00 10 mg via ORAL
  Filled 2019-09-04: qty 1

## 2019-09-04 MED ORDER — OXYCODONE HCL 5 MG PO TABS
ORAL_TABLET | ORAL | Status: AC
  Administered 2019-09-04: 10:00:00 5 mg via ORAL
  Filled 2019-09-04: qty 1

## 2019-09-04 MED ORDER — GABAPENTIN 300 MG PO CAPS: 300.0000 mg | ORAL_CAPSULE | ORAL | Status: AC

## 2019-09-04 MED ORDER — FENTANYL CITRATE (PF) 100 MCG/2ML IJ SOLN
INTRAMUSCULAR | Status: AC
  Filled 2019-09-04: qty 4

## 2019-09-04 MED ORDER — PHENYLEPHRINE HCL (PRESSORS) 10 MG/ML IV SOLN
INTRAVENOUS | Status: AC
  Filled 2019-09-04: qty 1

## 2019-09-04 MED ORDER — SUGAMMADEX SODIUM 200 MG/2ML IV SOLN
INTRAVENOUS | Status: AC
  Filled 2019-09-04: qty 2

## 2019-09-04 MED ORDER — SUGAMMADEX SODIUM 200 MG/2ML IV SOLN
INTRAVENOUS | Status: DC | PRN
  Administered 2019-09-04: 200 mg via INTRAVENOUS

## 2019-09-04 MED ORDER — PROPOFOL 10 MG/ML IV BOLUS
INTRAVENOUS | Status: DC | PRN
  Administered 2019-09-04: 150 mg via INTRAVENOUS

## 2019-09-04 MED ORDER — ONDANSETRON HCL 4 MG/2ML IJ SOLN
INTRAMUSCULAR | Status: AC
  Filled 2019-09-04: qty 2

## 2019-09-04 MED ORDER — SODIUM CHLORIDE FLUSH 0.9 % IV SOLN
INTRAVENOUS | Status: AC
  Filled 2019-09-04: qty 10

## 2019-09-04 MED ORDER — CELECOXIB 200 MG PO CAPS
ORAL_CAPSULE | ORAL | Status: AC
  Administered 2019-09-04: 200 mg via ORAL
  Filled 2019-09-04: qty 1

## 2019-09-04 MED ORDER — ONDANSETRON HCL 4 MG/2ML IJ SOLN
4.0000 mg | Freq: Once | INTRAMUSCULAR | Status: AC | PRN
  Administered 2019-09-04: 11:00:00 4 mg via INTRAVENOUS

## 2019-09-04 MED ORDER — KETOROLAC TROMETHAMINE 30 MG/ML IJ SOLN
INTRAMUSCULAR | Status: AC
  Administered 2019-09-04: 10:00:00 30 mg via INTRAVENOUS
  Filled 2019-09-04: qty 1

## 2019-09-04 MED ORDER — BUPIVACAINE LIPOSOME 1.3 % IJ SUSP
INTRAMUSCULAR | Status: DC | PRN
  Administered 2019-09-04: 20 mL

## 2019-09-04 MED ORDER — ONDANSETRON HCL 4 MG/2ML IJ SOLN
INTRAMUSCULAR | Status: DC | PRN
  Administered 2019-09-04: 4 mg via INTRAVENOUS

## 2019-09-04 MED ORDER — FENTANYL CITRATE (PF) 100 MCG/2ML IJ SOLN
INTRAMUSCULAR | Status: DC | PRN
  Administered 2019-09-04 (×2): 100 ug via INTRAVENOUS

## 2019-09-04 MED ORDER — FENTANYL CITRATE (PF) 100 MCG/2ML IJ SOLN
25.0000 ug | INTRAMUSCULAR | Status: AC | PRN
  Administered 2019-09-04 (×2): 25 ug via INTRAVENOUS

## 2019-09-04 MED ORDER — BUPIVACAINE HCL (PF) 0.25 % IJ SOLN
INTRAMUSCULAR | Status: AC
  Filled 2019-09-04: qty 30

## 2019-09-04 MED ORDER — KETOROLAC TROMETHAMINE 30 MG/ML IJ SOLN: 30.0000 mg | Freq: Once | INTRAMUSCULAR | Status: AC

## 2019-09-04 MED ORDER — PROPOFOL 10 MG/ML IV BOLUS
INTRAVENOUS | Status: AC
  Filled 2019-09-04: qty 40

## 2019-09-04 MED ORDER — LIDOCAINE HCL (PF) 2 % IJ SOLN
INTRAMUSCULAR | Status: AC
  Filled 2019-09-04: qty 10

## 2019-09-04 MED ORDER — BUPIVACAINE LIPOSOME 1.3 % IJ SUSP
INTRAMUSCULAR | Status: AC
  Filled 2019-09-04: qty 20

## 2019-09-04 MED ORDER — FENTANYL CITRATE (PF) 100 MCG/2ML IJ SOLN
25.0000 ug | INTRAMUSCULAR | Status: AC | PRN
  Administered 2019-09-04 (×4): 25 ug via INTRAVENOUS

## 2019-09-04 MED ORDER — CELECOXIB 200 MG PO CAPS: 200.0000 mg | ORAL_CAPSULE | ORAL | Status: AC

## 2019-09-04 MED ORDER — PHENYLEPHRINE HCL (PRESSORS) 10 MG/ML IV SOLN
INTRAVENOUS | Status: DC | PRN
  Administered 2019-09-04: 100 ug via INTRAVENOUS
  Administered 2019-09-04: 200 ug via INTRAVENOUS
  Administered 2019-09-04 (×5): 100 ug via INTRAVENOUS

## 2019-09-04 MED ORDER — MIDAZOLAM HCL 2 MG/2ML IJ SOLN
INTRAMUSCULAR | Status: AC
  Filled 2019-09-04: qty 2

## 2019-09-04 MED ORDER — CEFAZOLIN SODIUM-DEXTROSE 2-4 GM/100ML-% IV SOLN
INTRAVENOUS | Status: AC
  Filled 2019-09-04: qty 100

## 2019-09-04 MED ORDER — SUCCINYLCHOLINE CHLORIDE 20 MG/ML IJ SOLN
INTRAMUSCULAR | Status: AC
  Filled 2019-09-04: qty 1

## 2019-09-04 MED ORDER — ROCURONIUM BROMIDE 100 MG/10ML IV SOLN
INTRAVENOUS | Status: DC | PRN
  Administered 2019-09-04: 50 mg via INTRAVENOUS
  Administered 2019-09-04: 10 mg via INTRAVENOUS

## 2019-09-04 MED ORDER — KETOROLAC TROMETHAMINE 30 MG/ML IJ SOLN
INTRAMUSCULAR | Status: AC
  Filled 2019-09-04: qty 1

## 2019-09-04 MED ORDER — ACETAMINOPHEN 500 MG PO TABS
ORAL_TABLET | ORAL | Status: AC
  Administered 2019-09-04: 07:00:00 1000 mg via ORAL
  Filled 2019-09-04: qty 2

## 2019-09-04 MED ORDER — CHLORHEXIDINE GLUCONATE CLOTH 2 % EX PADS
6.0000 | MEDICATED_PAD | Freq: Once | CUTANEOUS | Status: AC
  Administered 2019-09-04: 07:00:00 6 via TOPICAL

## 2019-09-04 MED ORDER — LIDOCAINE HCL (CARDIAC) PF 100 MG/5ML IV SOSY
PREFILLED_SYRINGE | INTRAVENOUS | Status: DC | PRN
  Administered 2019-09-04: 100 mg via INTRAVENOUS

## 2019-09-04 MED ORDER — PROMETHAZINE HCL 25 MG/ML IJ SOLN: 6.2500 mg | Freq: Once | INTRAMUSCULAR | Status: AC

## 2019-09-04 MED ORDER — OXYCODONE HCL 5 MG PO TABS: 5.0000 mg | ORAL_TABLET | Freq: Once | ORAL | Status: AC

## 2019-09-04 MED ORDER — GABAPENTIN 300 MG PO CAPS
ORAL_CAPSULE | ORAL | Status: AC
  Administered 2019-09-04: 07:00:00 300 mg via ORAL
  Filled 2019-09-04: qty 1

## 2019-09-04 MED ORDER — CHLORHEXIDINE GLUCONATE CLOTH 2 % EX PADS
6.0000 | MEDICATED_PAD | Freq: Once | CUTANEOUS | Status: AC
  Administered 2019-09-03: 6 via TOPICAL

## 2019-09-04 MED ORDER — PROMETHAZINE HCL 25 MG/ML IJ SOLN
INTRAMUSCULAR | Status: AC
  Administered 2019-09-04: 13:00:00 6.25 mg via INTRAVENOUS
  Filled 2019-09-04: qty 1

## 2019-09-04 MED ORDER — LACTATED RINGERS IV SOLN: INTRAVENOUS | Status: DC

## 2019-09-04 MED ORDER — ACETAMINOPHEN 500 MG PO TABS: 1000.0000 mg | ORAL_TABLET | ORAL | Status: AC

## 2019-09-04 MED ORDER — DEXAMETHASONE SODIUM PHOSPHATE 10 MG/ML IJ SOLN
INTRAMUSCULAR | Status: AC
  Filled 2019-09-04: qty 1

## 2019-09-04 MED ORDER — MIDAZOLAM HCL 2 MG/2ML IJ SOLN
INTRAMUSCULAR | Status: DC | PRN
  Administered 2019-09-04: 2 mg via INTRAVENOUS

## 2019-09-04 MED ORDER — BUPIVACAINE LIPOSOME 1.3 % IJ SUSP: 20.0000 mL | Freq: Once | INTRAMUSCULAR | Status: DC

## 2019-09-04 SURGICAL SUPPLY — 47 items
BLADE SURG SZ11 CARB STEEL (BLADE) ×2 IMPLANT
CANISTER SUCT 1200ML W/VALVE (MISCELLANEOUS) ×2 IMPLANT
CHLORAPREP W/TINT 26 (MISCELLANEOUS) ×2 IMPLANT
COVER TIP SHEARS 8 DVNC (MISCELLANEOUS) ×1 IMPLANT
COVER TIP SHEARS 8MM DA VINCI (MISCELLANEOUS) ×1
COVER WAND RF STERILE (DRAPES) ×2 IMPLANT
DEFOGGER SCOPE WARMER CLEARIFY (MISCELLANEOUS) ×2 IMPLANT
DERMABOND ADVANCED (GAUZE/BANDAGES/DRESSINGS) ×1
DERMABOND ADVANCED .7 DNX12 (GAUZE/BANDAGES/DRESSINGS) ×1 IMPLANT
DRAPE 3/4 80X56 (DRAPES) ×2 IMPLANT
DRAPE ARM DVNC X/XI (DISPOSABLE) ×4 IMPLANT
DRAPE COLUMN DVNC XI (DISPOSABLE) ×1 IMPLANT
DRAPE DA VINCI XI ARM (DISPOSABLE) ×4
DRAPE DA VINCI XI COLUMN (DISPOSABLE) ×1
ELECT CAUTERY BLADE 6.4 (BLADE) ×2 IMPLANT
ELECT REM PT RETURN 9FT ADLT (ELECTROSURGICAL) ×2
ELECTRODE REM PT RTRN 9FT ADLT (ELECTROSURGICAL) ×1 IMPLANT
GLOVE BIO SURGEON STRL SZ7 (GLOVE) ×4 IMPLANT
GOWN STRL REUS W/ TWL LRG LVL3 (GOWN DISPOSABLE) ×3 IMPLANT
GOWN STRL REUS W/TWL LRG LVL3 (GOWN DISPOSABLE) ×3
GRASPER SUT TROCAR 14GX15 (MISCELLANEOUS) ×2 IMPLANT
IRRIGATION STRYKERFLOW (MISCELLANEOUS) IMPLANT
IRRIGATOR STRYKERFLOW (MISCELLANEOUS)
IV NS 1000ML (IV SOLUTION)
IV NS 1000ML BAXH (IV SOLUTION) IMPLANT
KIT PINK PAD W/HEAD ARE REST (MISCELLANEOUS) ×2
KIT PINK PAD W/HEAD ARM REST (MISCELLANEOUS) ×1 IMPLANT
LABEL OR SOLS (LABEL) ×2 IMPLANT
MESH VENT LT ST 11.4CM CRL (Mesh General) ×1 IMPLANT
NEEDLE HYPO 22GX1.5 SAFETY (NEEDLE) ×2 IMPLANT
OBTURATOR OPTICAL STANDARD 8MM (TROCAR) ×1
OBTURATOR OPTICAL STND 8 DVNC (TROCAR) ×1
OBTURATOR OPTICALSTD 8 DVNC (TROCAR) ×1 IMPLANT
PACK LAP CHOLECYSTECTOMY (MISCELLANEOUS) ×2 IMPLANT
PENCIL ELECTRO HAND CTR (MISCELLANEOUS) ×2 IMPLANT
SEAL CANN UNIV 5-8 DVNC XI (MISCELLANEOUS) ×3 IMPLANT
SEAL XI 5MM-8MM UNIVERSAL (MISCELLANEOUS) ×3
SOLUTION ELECTROLUBE (MISCELLANEOUS) ×2 IMPLANT
SPONGE LAP 18X18 RF (DISPOSABLE) ×2 IMPLANT
SUT MNCRL 4-0 (SUTURE) ×1
SUT MNCRL 4-0 27XMFL (SUTURE) ×1
SUT STRATAFIX SPIRAL PDS+ 0 30 (SUTURE) ×2 IMPLANT
SUT VICRYL 0 AB UR-6 (SUTURE) ×4 IMPLANT
SUT VLOC 90 2/L VL 12 GS22 (SUTURE) ×4 IMPLANT
SUTURE MNCRL 4-0 27XMF (SUTURE) ×1 IMPLANT
TROCAR 130MM GELPORT  DAV (MISCELLANEOUS) ×2 IMPLANT
TUBING EVAC SMOKE HEATED PNEUM (TUBING) ×2 IMPLANT

## 2019-09-04 NOTE — Op Note (Signed)
Robotic assisted laparoscopic ventral hernia Repair IPOM using  Round 11.4cm ventralight BARD mesh   Pre-operative Diagnosis: ventral hernia   Post-operative Diagnosis: 2 cm ventral epigastric hernia 1 cm umbilical hernia   Surgeon: [name], MD FACS   Anesthesia: Gen. with endotracheal tube     Findings: 2 cm ventral epigastric hernia 1 cm umbilical hernia    Estimated Blood Loss: 5cc         Complications: none             Procedure Details  The patient was seen again in the Holding Room. The benefits, complications, treatment options, and expected outcomes were discussed with the patient. The risks of bleeding, infection, recurrence of symptoms, failure to resolve symptoms, bowel injury, mesh placement, mesh infection, any of which could require further surgery were reviewed with the patient. The likelihood of improving the patient's symptoms with return to their baseline status is good.  The patient and/or family concurred with the proposed plan, giving informed consent.  The patient was taken to Operating Room, identified and the procedure verified.  A Time Out was held and the above information confirmed.   Prior to the induction of general anesthesia, antibiotic prophylaxis was administered. VTE prophylaxis was in place. General endotracheal anesthesia was then administered and tolerated well. After the induction, the abdomen was prepped with Chloraprep and draped in the sterile fashion. The patient was positioned in the supine position.   We used a left upper quadrant subcostal incision and using a cutdown technique were able to identify the fascia both anteriorly and posteriorly elevated incised and 2 stay sutures were placed.  Hassan trocar inserted and pneumoperitoneum obtained.  No hemodynamic compromise.   2 additional 8 mm ports were placed under direct visualization.  I visualized the hernia and there was an epigastric ventral hernia measuring approximately 2 cm, an  additional adjacent umbilical hernia was seen measuring 1 cm.  At this time I went ahead and inserted the  11.4cm mesh with an echo location.   The robot was brought to the surgical field and docked in the standard fashion.  We made sure that all instrumentation was kept under direct vision at all times and there was no collision between the arms.  I scrubbed out and went to the console.   .  Using a 0 stratafix suture we closed the ventral defects primarily.  The PMI was used to pierce the defect in the center.  Using the mesh and the echo location device we were able to bring the mesh towards the abdominal wall. Falciform was taken down with electrocautery to allow adequate mesh placement.   The mesh was secured circumferentially to the abdominal wall using 2 OV lock in the standard fashion.   The mesh layed really nicely against the  abdominal wall. A second look laparoscopy revealed no evidence of intra-abdominal injury.    All the needles and foreign objects were removed under direct visualization.  The instruments were removed and the robot was undocked.  I scrubbed back in, The laparoscopic ports were removed under direct visualization and the pneumoperitoneum was deflated.   Incisions were closed with  4-0 Monocryl  And the fascial sutures approximated in the standard fashion Dermabond was used to coat the skin. Marcaine quarter percent with epinephrine and lidocaine 1% was used to inject all the incision sites. Patient tolerated procedure well and there were no immediate complications. Needle and laparotomy counts were correct    [name], MD,  FACS

## 2019-09-04 NOTE — OR Nursing (Signed)
Two rings on LFT ring finer taped in per-op

## 2019-09-04 NOTE — Discharge Instructions (Addendum)
Laparoscopic Ventral Hernia Repair, Care After This sheet gives you information about how to care for yourself after your procedure. Your health care provider may also give you more specific instructions. If you have problems or questions, contact your health care provider. What can I expect after the procedure? After the procedure, it is common to have:  Pain, discomfort, or soreness. Follow these instructions at home: Incision care   Follow instructions from your health care provider about how to take care of your incision. Make sure you: ? Wash your hands with soap and water before you change your bandage (dressing) or before you touch your abdomen. If soap and water are not available, use hand sanitizer. ? Change your dressing as told by your health care provider. ? Leave stitches (sutures), skin glue, or adhesive strips in place. These skin closures may need to stay in place for 2 weeks or longer. If adhesive strip edges start to loosen and curl up, you may trim the loose edges. Do not remove adhesive strips completely unless your health care provider tells you to do that.  Check your incision area every day for signs of infection. Check for: ? Redness, swelling, or pain. ? Fluid or blood. ? Warmth. ? Pus or a bad smell. Bathing   Do not take baths, swim, or use a hot tub until your health care provider approves. Ask your health care provider if you can take showers. You may only be allowed to take sponge baths for bathing.  Keep your bandage (dressing) dry until your health care provider says it can be removed. Activity  Do not lift anything that is heavier than 10 lb (4.5 kg) until your health care provider approves.  Do not drive or use heavy machinery while taking prescription pain medicine. Ask your health care provider when it is safe for you to drive or use heavy machinery.  Do not drive for 24 hours if you were given a medicine to help you relax (sedative) during your  procedure.  Rest as told by your health care provider. You may return to your normal activities when your health care provider approves. General instructions  Take over-the-counter and prescription medicines only as told by your health care provider.  To prevent or treat constipation while you are taking prescription pain medicine, your health care provider may recommend that you: ? Take over-the-counter or prescription medicines. ? Eat foods that are high in fiber, such as fresh fruits and vegetables, whole grains, and beans. ? Limit foods that are high in fat and processed sugars, such as fried and sweet foods.  Drink enough fluid to keep your urine clear or pale yellow.  Hold a pillow over your abdomen when you cough or sneeze. This helps with pain.  Keep all follow-up visits as told by your health care provider. This is important. Contact a health care provider if:  You have: ? A fever or chills. ? Redness, swelling, or pain around your incision. ? Fluid or blood coming from your incision. ? Pus or a bad smell coming from your incision. ? Pain that gets worse or does not get better with medicine. ? Nausea or vomiting. ? A cough. ? Shortness of breath.  Your incision feels warm to the touch.  You have not had a bowel movement in three days.  You are not able to urinate. Get help right away if:  You have severe pain in your abdomen.  You have persistent nausea and vomiting.  You have  redness, warmth, or pain in your leg.  You have chest pain.  You have trouble breathing. Summary  After this procedure, it is common to have pain, discomfort, or soreness.  Follow instructions from your health care provider about how to take care of your incision.  Check your incision area every day for signs of infection. Report any signs of infection to your health care provider.  Keep all follow-up visits as told by your health care provider. This is important. This information  is not intended to replace advice given to you by your health care provider. Make sure you discuss any questions you have with your health care provider.  AMBULATORY SURGERY  DISCHARGE INSTRUCTIONS   1) The drugs that you were given will stay in your system until tomorrow so for the next 24 hours you should not:  A) Drive an automobile B) Make any legal decisions C) Drink any alcoholic beverage   2) You may resume regular meals tomorrow.  Today it is better to start with liquids and gradually work up to solid foods.  You may eat anything you prefer, but it is better to start with liquids, then soup and crackers, and gradually work up to solid foods.   3) Please notify your doctor immediately if you have any unusual bleeding, trouble breathing, redness and pain at the surgery site, drainage, fever, or pain not relieved by medication.    4) Additional Instructions:        Please contact your physician with any problems or Same Day Surgery at [phone], Monday through Friday 6 am to 4 pm, or [place] at [facility] number at [phone]. Document Released: 08/16/2012 Document Revised: 08/12/2017 Document Reviewed: 04/21/2016 Elsevier Patient Education  2020 [employer].

## 2019-09-04 NOTE — Anesthesia Procedure Notes (Signed)
Procedure Name: Intubation Date/Time: 09/04/2019 7:37 AM Performed by: Chanetta Marshall, CRNA Pre-anesthesia Checklist: Patient identified, Emergency Drugs available, Suction available and Patient being monitored Patient Re-evaluated:Patient Re-evaluated prior to induction Oxygen Delivery Method: Circle system utilized Preoxygenation: Pre-oxygenation with 100% oxygen Induction Type: IV induction Ventilation: Mask ventilation without difficulty Laryngoscope Size: McGraph and 3 Grade View: Grade I Tube type: Oral Number of attempts: 1 Airway Equipment and Method: Video-laryngoscopy Placement Confirmation: ETT inserted through vocal cords under direct vision,  positive ETCO2,  breath sounds checked- equal and bilateral and CO2 detector Secured at: 22 cm Tube secured with: Tape Dental Injury: Teeth and Oropharynx as per pre-operative assessment

## 2019-09-04 NOTE — Transfer of Care (Signed)
Immediate Anesthesia Transfer of Care Note  Patient: [name]  Procedure(s) Performed: XI ROBOTIC ASSISTED VENTRAL HERNIA (N/A )  Patient Location: PACU  Anesthesia Type:General  Level of Consciousness: awake, alert  and oriented  Airway & Oxygen Therapy: Patient Spontanous Breathing  Post-op Assessment: Report given to RN and Post -op Vital signs reviewed and stable  Post vital signs: Reviewed and stable  Last Vitals:  Vitals Value Taken Time  BP    Temp    Pulse    Resp    SpO2      Last Pain:  Vitals:   09/04/19 0617  TempSrc: Oral  PainSc: 0-No pain         Complications: No apparent anesthesia complications

## 2019-09-04 NOTE — Anesthesia Post-op Follow-up Note (Signed)
Anesthesia QCDR form completed.        

## 2019-09-04 NOTE — H&P (Signed)
HPI [name] Carolyn Barnes is a 33 y.o. female seen in consultation at the request of Dr. Bernardo Heater from radiology.  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  She reports her last few weeks she has noticed a tender bulge within the abdominal wall.  Reports intermittent moderate pain that is sharp and worsening with Valsalva.  There is no evidence of incarceration or strangulation.  She noticed that she has had this hernia for a while but only recently became symptomatic.  Denies any fevers any chills no nausea no vomiting.  No previous major abdominal operations.  She is able to perform more than 4 METS of activity without any shortness of breath or chest pain.  BMP and CBC is completely normal. SHe denies any smoking history  HPI      Past Medical History:  Diagnosis Date  . Anxiety   . Fatigue   . Indigestion   . Migraine headache   . Seasonal allergies   . Vitamin D deficiency disease          Past Surgical History:  Procedure Laterality Date  . DILATION AND CURETTAGE OF UTERUS  02/2013,10/2013  . ESOPHAGOGASTRODUODENOSCOPY  02/2014   Dr Allen Norris - small HH and gastritis         Family History  Problem Relation Age of Onset  . Heart disease Maternal Grandmother   . Diabetes Maternal Grandmother   . Healthy Mother     Social History Social History       Tobacco Use  . Smoking status: Never Smoker  . Smokeless tobacco: Never Used  Substance Use Topics  . Alcohol use: No  . Drug use: No    No Known Allergies        Current Outpatient Medications  Medication Sig Dispense Refill  . Biotin 10 MG CAPS Take 1 tablet by mouth 1 day or 1 dose.    . fluticasone (FLONASE) 50 MCG/ACT nasal spray Place 2 sprays into both nostrils daily. 16 g 0  . pantoprazole (PROTONIX) 20 MG tablet TAKE 1 TABLET BY MOUTH DAILY 90 tablet 1  . Plecanatide (TRULANCE) 3 MG TABS Take 1 tablet by mouth daily at 2 PM. 90 tablet 3  . Calcium Carb-Cholecalciferol (CALCIUM 1000 + D PO)       No current facility-administered medications for this visit.      Review of Systems Full ROS  was asked and was negative except for the information on the HPI  Physical Exam CONSTITUTIONAL:NAD EYES: Pupils are equal, round, and reactive to light, Sclera are non-icteric. EARS, NOSE, MOUTH AND THROAT: The oropharynx is clear. The oral mucosa is pink and moist. Hearing is intact to voice. LYMPH NODES:  Lymph nodes in the neck are normal. RESPIRATORY:  Lungs are clear. There is normal respiratory effort, with equal breath sounds bilaterally, and without pathologic use of accessory muscles. CARDIOVASCULAR: Heart is regular without murmurs, gallops, or rubs. GI: The abdomen is soft, there is evidence of a 2 to 3 cm epigastric ventral hernia.  It is tender to palpation.  No peritonitis.  No masses no rebound.  GU: Rectal deferred.   MUSCULOSKELETAL: Normal muscle strength and tone. No cyanosis or edema.   SKIN: Turgor is good and there are no pathologic skin lesions or ulcers. NEUROLOGIC: Motor and sensation is grossly normal. Cranial nerves are grossly intact. PSYCH:  Oriented to person, place and time. Affect is normal.  Data Reviewed  I have personally reviewed the patient's imaging, laboratory findings and medical records.  Assessment/Plan 33 year old female with a symptomatic epigastric ventral hernia.  Discussed with the patient in detail about her disease process given her symptoms I do recommend repair.  I do think that she is a great candidate for robotic repair.  Procedure discussed with the patient in detail.  Risk, benefits and possible complications including but not limited to: Bleeding, infection, bowel injuries, chronic pain and mesh issues.  She understands and wishes to proceed.  We discussed with her in detail about postoperative outcomes and expectations.    [name], MD [facility] General Surgeon

## 2019-09-04 NOTE — Anesthesia Preprocedure Evaluation (Signed)
Anesthesia Evaluation  Patient identified by MRN, date of birth, ID band Patient awake    Reviewed: Allergy & Precautions, H&P , NPO status , Patient's Chart, lab work & pertinent test results, reviewed documented beta blocker date and time   Airway Mallampati: II  TM Distance: >3 FB Neck ROM: full    Dental  (+) Teeth Intact   Pulmonary neg pulmonary ROS,    Pulmonary exam normal        Cardiovascular negative cardio ROS Normal cardiovascular exam Rhythm:regular Rate:Normal     Neuro/Psych  Headaches, PSYCHIATRIC DISORDERS Anxiety Depression  Neuromuscular disease negative neurological ROS  negative psych ROS   GI/Hepatic negative GI ROS, Neg liver ROS, hiatal hernia, GERD  ,  Endo/Other  negative endocrine ROS  Renal/GU negative Renal ROS  negative genitourinary   Musculoskeletal   Abdominal   Peds  Hematology negative hematology ROS (+)   Anesthesia Other Findings Past Medical History: No date: Anxiety No date: Fatigue No date: GERD (gastroesophageal reflux disease) No date: History of hiatal hernia No date: Indigestion No date: Migraine headache No date: Seasonal allergies No date: Vitamin D deficiency disease Past Surgical History: 02/2013,10/2013: DILATION AND CURETTAGE OF UTERUS 02/2014: ESOPHAGOGASTRODUODENOSCOPY     Comment:  Dr Allen Norris - small HH and gastritis   Reproductive/Obstetrics negative OB ROS                             Anesthesia Physical Anesthesia Plan  ASA: II  Anesthesia Plan: General ETT   Post-op Pain Management:    Induction:   PONV Risk Score and Plan:   Airway Management Planned:   Additional Equipment:   Intra-op Plan:   Post-operative Plan:   Informed Consent: I have reviewed the patients History and Physical, chart, labs and discussed the procedure including the risks, benefits and alternatives for the proposed anesthesia with the patient  or authorized representative who has indicated his/her understanding and acceptance.     Dental Advisory Given  Plan Discussed with: CRNA  Anesthesia Plan Comments:         Anesthesia Quick Evaluation

## 2019-09-05 NOTE — Anesthesia Postprocedure Evaluation (Signed)
Anesthesia Post Note  Patient: Carolyn Barnes  Procedure(s) Performed: XI ROBOTIC ASSISTED VENTRAL HERNIA (N/A )  Patient location during evaluation: PACU Anesthesia Type: General Level of consciousness: awake and alert Pain management: pain level controlled Vital Signs Assessment: post-procedure vital signs reviewed and stable Respiratory status: spontaneous breathing, nonlabored ventilation, respiratory function stable and patient connected to nasal cannula oxygen Cardiovascular status: blood pressure returned to baseline and stable Postop Assessment: no apparent nausea or vomiting Anesthetic complications: no     Last Vitals:  Vitals:   09/04/19 1327 09/04/19 1412  BP: 115/71 115/69  Pulse:  94  Resp:  18  Temp:  36.9 C  SpO2:  100%    Last Pain:  Vitals:   09/04/19 1412  TempSrc: Temporal  PainSc: [place]

## 2019-09-19 ENCOUNTER — Other Ambulatory Visit: Payer: Self-pay

## 2019-09-19 ENCOUNTER — Telehealth (INDEPENDENT_AMBULATORY_CARE_PROVIDER_SITE_OTHER): Payer: Self-pay | Admitting: Surgery

## 2019-09-19 DIAGNOSIS — Z09 Encounter for follow-up examination after completed treatment for conditions other than malignant neoplasm: Secondary | ICD-10-CM

## 2019-09-19 NOTE — Progress Notes (Signed)
Called pt on her cell S/p rob Ventral Hernia 12/22 Doing very well Taking PO, no fevers or chills No concerns Keep lifting restrictions F/U prn

## 2019-10-22 ENCOUNTER — Encounter: Payer: Self-pay | Admitting: Obstetrics and Gynecology

## 2019-10-22 ENCOUNTER — Ambulatory Visit (INDEPENDENT_AMBULATORY_CARE_PROVIDER_SITE_OTHER): Payer: No Typology Code available for payment source | Admitting: Obstetrics and Gynecology

## 2019-10-22 ENCOUNTER — Other Ambulatory Visit: Payer: Self-pay

## 2019-10-22 VITALS — BP 125/82 | HR 102 | Ht <= 58 in | Wt 159.1 lb

## 2019-10-22 DIAGNOSIS — Z30432 Encounter for removal of intrauterine contraceptive device: Secondary | ICD-10-CM | POA: Diagnosis not present

## 2019-10-22 NOTE — Progress Notes (Signed)
     GYNECOLOGY OFFICE PROCEDURE NOTE  Carolyn Barnes is a 34 y.o. [phone] here for Mirena IUD removal. Complains of heavier cycles after the first 2 months of use. Also noting worsening of PMDD symptoms. Partner can sometimes feel strings.  Last pap smear was on 07/2017 and was normal.  IUD Removal  Patient identified, informed consent performed, consent signed.  Patient was in the dorsal lithotomy position, normal external genitalia was noted.  A speculum was placed in the patient's vagina, normal discharge was noted, no lesions. The cervix was visualized, no lesions, no abnormal discharge.  The strings of the IUD were grasped and pulled using ring forceps. The IUD was removed in its entirety. Patient tolerated the procedure well.    Patient will use vasectomy for contraception. Routine preventative health maintenance measures emphasized.    [name], MD [facility]

## 2019-10-22 NOTE — Progress Notes (Signed)
Pt present for IUD removal. Pt stated that she was doing well no problems.

## 2020-01-07 ENCOUNTER — Ambulatory Visit: Payer: No Typology Code available for payment source | Admitting: Nurse Practitioner

## 2020-01-21 ENCOUNTER — Ambulatory Visit: Payer: No Typology Code available for payment source | Admitting: Nurse Practitioner

## 2020-02-04 ENCOUNTER — Other Ambulatory Visit: Payer: Self-pay

## 2020-02-04 ENCOUNTER — Ambulatory Visit (INDEPENDENT_AMBULATORY_CARE_PROVIDER_SITE_OTHER): Payer: No Typology Code available for payment source | Admitting: Nurse Practitioner

## 2020-02-04 ENCOUNTER — Encounter: Payer: Self-pay | Admitting: Nurse Practitioner

## 2020-02-04 VITALS — BP 118/78 | HR 75 | Temp 98.0°F | Ht 58.6 in | Wt 154.6 lb

## 2020-02-04 DIAGNOSIS — E559 Vitamin D deficiency, unspecified: Secondary | ICD-10-CM | POA: Diagnosis not present

## 2020-02-04 DIAGNOSIS — G43009 Migraine without aura, not intractable, without status migrainosus: Secondary | ICD-10-CM | POA: Diagnosis not present

## 2020-02-04 MED ORDER — UBRELVY 50 MG PO TABS: 1.0000 | ORAL_TABLET | Freq: Every day | ORAL | 1 refills | Status: DC

## 2020-02-04 NOTE — Progress Notes (Signed)
New Patient Office Visit  Subjective:  Patient ID: Carolyn Barnes, female    DOB: Dec 09, 1985  Age: 34 y.o. MRN: [id number]  CC:  Chief Complaint  Patient presents with  . Establish Care  . Headache    HPI  Carolyn Barnes presents to establish care as a new patient. She was at [facility] with Dr. Carolin Coy and was there for about five years. She last saw them in November for medication appointment. She is a Psychologist, sport and exercise at [employer]. She has done that eight years, She went to [employer] and plans to go back to nursing school. Has two children that are 11 year and 4 year. She had a upper and lower GI for chronic constipation. She is exercising three days a week doing step aerobics. She was doing weight watchers and was successful and lost 10lbs. Has some swelling in her hands usually once a month and complaints of numbness. Her prior PCP recommended PT but she was not able to complete. She sees a dentis and eye doctor yearly.  She sees Dr. Marcelline Mates with [facility]. She had Covid in April and has no residual and was asymptomatic.   Maternal Grandmother had stroke and dementia and Maternal Grandfather passed away from esophageal cancer. -She has one sister that is good health. -Not in contact with father and has no knowledge of his medical history  -Headaches New concern they have been since child hood and has not been to a specialist and states that they are not migraine. She describes them as a frontal throbbing that is dull and aching but sometimes tries caffeine and NSAID. Pain is a 5/10 since this morning. She does have seasonal allergies and take Claritin and zyrtec.   Headache  This is a chronic problem. Pertinent negatives include no coughing, dizziness, sinus pressure or visual change. Exacerbated by: stress, she does eat high carbs. She has tried triptans and NSAIDs (tried imitrex years ago and was to take topiramate but did not feel needed a  daily medications., when she exercises she does feel better. ) for the symptoms. Her past medical history is significant for migraine headaches and obesity. There is no history of cancer or hypertension.     Past Medical History:  Diagnosis Date  . Anxiety   . Fatigue   . GERD (gastroesophageal reflux disease)   . History of hiatal hernia   . Indigestion   . Migraine headache   . Seasonal allergies   . Vitamin D deficiency disease     Past Surgical History:  Procedure Laterality Date  . DILATION AND CURETTAGE OF UTERUS  02/2013,10/2013  . ESOPHAGOGASTRODUODENOSCOPY  02/2014   Dr Allen Norris - small HH and gastritis  . XI ROBOTIC ASSISTED VENTRAL HERNIA N/A 09/04/2019   Procedure: XI ROBOTIC ASSISTED VENTRAL HERNIA;  Surgeon: Jules Husbands, MD;  Location: [facility] ORS;  Service: General;  Laterality: N/A;    Family History  Problem Relation Age of Onset  . Heart disease Maternal Grandmother   . Diabetes Maternal Grandmother   . Healthy Mother   . Healthy Daughter   . Breast cancer Neg Hx   . Ovarian cancer Neg Hx   . Colon cancer Neg Hx     Social History   Socioeconomic History  . Marital status: Married    Spouse name: Not on file  . Number of children: 2  . Years of education: Not on file  . Highest education level: Not on file  Occupational History  . Not on file  Tobacco Use  . Smoking status: Never Smoker  . Smokeless tobacco: Never Used  Substance and Sexual Activity  . Alcohol use: No  . Drug use: No  . Sexual activity: Yes    Birth control/protection: None, Other-see comments    Comment: Husband-vasectomy  Other Topics Concern  . Not on file  Social History Narrative  . Not on file   Social Determinants of Health   Financial Resource Strain:   . Difficulty of Paying Living Expenses:   Food Insecurity:   . Worried About Charity fundraiser in the Last Year:   . Arboriculturist in the Last Year:   Transportation Needs:   . Film/video editor  (Medical):   Marland Kitchen Lack of Transportation (Non-Medical):   Physical Activity:   . Days of Exercise per Week:   . Minutes of Exercise per Session:   Stress:   . Feeling of Stress :   Social Connections:   . Frequency of Communication with Friends and Family:   . Frequency of Social Gatherings with Friends and Family:   . Attends Religious Services:   . Active Member of Clubs or Organizations:   . Attends Archivist Meetings:   Marland Kitchen Marital Status:   Intimate Partner Violence:   . Fear of Current or Ex-Partner:   . Emotionally Abused:   Marland Kitchen Physically Abused:   . Sexually Abused:     ROS Review of Systems  Constitutional: Negative for fatigue.  HENT: Negative for sinus pressure.   Respiratory: Negative for cough.   Cardiovascular: Negative.  Negative for chest pain, palpitations and leg swelling.  Neurological: Positive for headaches. Negative for dizziness.  Psychiatric/Behavioral: Negative.     Objective:   Today's Vitals: BP 118/78   Pulse 75   Temp 98 F (36.7 C)   Ht 4' 10.6" (1.488 m)   Wt 154 lb 9.6 oz (70.1 kg)   LMP 01/28/2020 (Exact Date)   SpO2 98%   BMI 31.65 kg/m   Physical Exam Constitutional:      Appearance: She is well-developed.  HENT:     Head: Normocephalic and atraumatic.  Eyes:     Extraocular Movements: Extraocular movements intact.     Left eye: No nystagmus.  Cardiovascular:     Rate and Rhythm: Normal rate and regular rhythm.     Heart sounds: Normal heart sounds. No murmur.  Pulmonary:     Effort: Pulmonary effort is normal. No respiratory distress.     Breath sounds: Normal breath sounds.  Musculoskeletal:     Cervical back: Normal range of motion and neck supple.  Skin:    Capillary Refill: Capillary refill takes less than 2 seconds.  Neurological:     Mental Status: She is alert and oriented to person, place, and time.  Psychiatric:        Mood and Affect: Mood normal.        Speech: Speech normal.        Behavior:  Behavior normal.     Assessment & Plan:   1. Migraine without aura and without status migrainosus, not intractable  She has tried a triptan in the past which was not effective.  Will try ubrelvy as needed  She is also encouraged to keep a headache log - CMP14+EGFR - CBC - Ubrogepant (UBRELVY) 50 MG TABS; Take 1 tablet by mouth daily.  Dispense: 10 tablet; Refill: 1  2. Vitamin D deficiency  Will check vitamin D level and supplement as needed.    Also encouraged to spend 15 minutes in the sun daily.  - VITAMIN D 25 Hydroxy (Vit-D Deficiency, Fractures)   Marylu Lund, RN   [name], DNP, FNP-BC

## 2020-02-04 NOTE — Patient Instructions (Signed)

## 2020-02-05 LAB — CMP14+EGFR
ALT: 11 IU/L (ref 0–32)
AST: 18 IU/L (ref 0–40)
Albumin/Globulin Ratio: 1.7 (ref 1.2–2.2)
Albumin: 4.6 g/dL (ref 3.8–4.8)
Alkaline Phosphatase: 48 IU/L (ref 48–121)
BUN/Creatinine Ratio: 9 (ref 9–23)
BUN: 7 mg/dL (ref 6–20)
Bilirubin Total: 0.3 mg/dL (ref 0.0–1.2)
CO2: 22 mmol/L (ref 20–29)
Calcium: 9.4 mg/dL (ref 8.7–10.2)
Chloride: 105 mmol/L (ref 96–106)
Creatinine, Ser: 0.81 mg/dL (ref 0.57–1.00)
GFR calc Af Amer: 110 mL/min/{1.73_m2} (ref >59)
GFR calc non Af Amer: 96 mL/min/{1.73_m2} (ref >59)
Globulin, Total: 2.7 g/dL (ref 1.5–4.5)
Glucose: 82 mg/dL (ref 65–99)
Potassium: 4 mmol/L (ref 3.5–5.2)
Sodium: 138 mmol/L (ref 134–144)
Total Protein: 7.3 g/dL (ref 6.0–8.5)

## 2020-02-05 LAB — CBC
Hematocrit: 38.5 % (ref 34.0–46.6)
Hemoglobin: 12.9 g/dL (ref 11.1–15.9)
MCH: 31.2 pg (ref 26.6–33.0)
MCHC: 33.5 g/dL (ref 31.5–35.7)
MCV: 93 fL (ref 79–97)
Platelets: 272 10*3/uL (ref 150–450)
RBC: 4.13 x10E6/uL (ref 3.77–5.28)
RDW: 11.3 % — ABNORMAL LOW (ref 11.7–15.4)
WBC: 5.9 10*3/uL (ref 3.4–10.8)

## 2020-02-05 LAB — VITAMIN D 25 HYDROXY (VIT D DEFICIENCY, FRACTURES): Vit D, 25-Hydroxy: 35.1 ng/mL (ref 30.0–100.0)

## 2020-02-07 ENCOUNTER — Encounter: Payer: Self-pay | Admitting: Nurse Practitioner

## 2020-02-12 ENCOUNTER — Telehealth: Payer: Self-pay

## 2020-02-12 NOTE — Telephone Encounter (Signed)
I notified the pt and pharmacy that the PA for Carolyn Barnes has been approved. YL,RMA

## 2020-03-05 ENCOUNTER — Other Ambulatory Visit: Payer: Self-pay

## 2020-03-05 ENCOUNTER — Ambulatory Visit (INDEPENDENT_AMBULATORY_CARE_PROVIDER_SITE_OTHER): Payer: No Typology Code available for payment source | Admitting: Nurse Practitioner

## 2020-03-05 ENCOUNTER — Encounter: Payer: Self-pay | Admitting: Nurse Practitioner

## 2020-03-05 VITALS — BP 120/82 | HR 84 | Temp 98.1°F | Ht 58.6 in | Wt 151.8 lb

## 2020-03-05 DIAGNOSIS — G43009 Migraine without aura, not intractable, without status migrainosus: Secondary | ICD-10-CM

## 2020-03-05 DIAGNOSIS — E6609 Other obesity due to excess calories: Secondary | ICD-10-CM | POA: Diagnosis not present

## 2020-03-05 DIAGNOSIS — R42 Dizziness and giddiness: Secondary | ICD-10-CM

## 2020-03-05 DIAGNOSIS — Z0000 Encounter for general adult medical examination without abnormal findings: Secondary | ICD-10-CM

## 2020-03-05 DIAGNOSIS — Z6831 Body mass index (BMI) 31.0-31.9, adult: Secondary | ICD-10-CM

## 2020-03-05 DIAGNOSIS — Z1159 Encounter for screening for other viral diseases: Secondary | ICD-10-CM

## 2020-03-05 NOTE — Progress Notes (Signed)
This visit occurred during the SARS-CoV-2 public health emergency.  Safety protocols were in place, including screening questions prior to the visit, additional usage of staff PPE, and extensive cleaning of exam room while observing appropriate contact time as indicated for disinfecting solutions.  Subjective:     Patient ID: Carolyn Barnes , female    DOB: 14-Oct-1985 , 34 y.o.   MRN: [id number]   Chief Complaint  Patient presents with  . Annual Exam    HPI  Here for HM   Wt Readings from Last 3 Encounters: 03/05/20 : 151 lb 12.8 oz (68.9 kg) 02/04/20 : 154 lb 9.6 oz (70.1 kg) 10/22/19 : 159 lb 1.6 oz (72.2 kg)  In the past she took phentermine after pregnancy.     The patient states she uses vasectomy for birth control. Last LMP was Patient's last menstrual period was 02/22/2020. Negative for Dysmenorrhea and Negative for Menorrhagia , she is being followed by GYN.  Negative for: breast discharge, breast lump(s), breast pain and breast self exam.  Pertinent negatives include abnormal bleeding (hematology), anxiety, decreased libido, depression, difficulty falling sleep, dyspareunia, history of infertility, nocturia, sexual dysfunction, sleep disturbances, urinary incontinence, urinary urgency, vaginal discharge and vaginal itching. Diet regular; tries to cut out carbs.  she is drinking approximately 32 oz. The patient states her exercise level is minimal, she is trying to work her way back up with the exercise having knee pain. She has done biofreeze in the past.     The patient's tobacco use is:  Social History   Tobacco Use  Smoking Status Never Smoker  Smokeless Tobacco Never Used   She has been exposed to passive smoke. The patient's alcohol use is:  Social History   Substance and Sexual Activity  Alcohol Use No   Additional information: Last pap - Dr. Marcelline Mates in [place] she is due in November 2021, next one scheduled for November 2021 Past Medical History:   Diagnosis Date  . Anxiety   . Fatigue   . GERD (gastroesophageal reflux disease)   . History of hiatal hernia   . Indigestion   . Migraine headache   . Seasonal allergies   . Vitamin D deficiency disease      Family History  Problem Relation Age of Onset  . Heart disease Maternal Grandmother   . Diabetes Maternal Grandmother   . Healthy Mother   . Healthy Daughter   . Breast cancer Neg Hx   . Ovarian cancer Neg Hx   . Colon cancer Neg Hx      Current Outpatient Medications:  .  cyclobenzaprine (FLEXERIL) 10 MG tablet, Take 1 tablet (10 mg total) by mouth at bedtime. (Patient taking differently: Take 10 mg by mouth daily as needed for muscle spasms. ), Disp: 90 tablet, Rfl: 0 .  fluticasone (FLONASE) 50 MCG/ACT nasal spray, Place 2 sprays into both nostrils daily., Disp: 16 g, Rfl: 0 .  pantoprazole (PROTONIX) 20 MG tablet, TAKE 1 TABLET BY MOUTH DAILY (Patient taking differently: Take 20 mg by mouth every morning. ), Disp: 90 tablet, Rfl: 1 .  Plecanatide (TRULANCE) 3 MG TABS, Take 1 tablet by mouth daily at 2 PM. (Patient taking differently: Take 3 mg by mouth daily at 2 PM. ), Disp: 90 tablet, Rfl: 3 .  Ubrogepant (UBRELVY) 50 MG TABS, Take 1 tablet by mouth daily., Disp: 10 tablet, Rfl: 1   No Known Allergies   Review of Systems  Constitutional: Negative.   HENT: Negative.  Eyes: Negative.   Respiratory: Negative.   Cardiovascular: Negative.  Negative for chest pain, palpitations and leg swelling.  Gastrointestinal: Negative.   Genitourinary: Negative.   Musculoskeletal: Negative.   Neurological: Negative.   Psychiatric/Behavioral: Negative.      Today's Vitals   03/05/20 1448  BP: 120/82  Pulse: 84  Temp: 98.1 F (36.7 C)  TempSrc: Oral  SpO2: 98%  Weight: 151 lb 12.8 oz (68.9 kg)  Height: 4' 10.6" (1.488 m)  PainSc: 0-No pain   Body mass index is 31.08 kg/m.   Objective:  Physical Exam Vitals reviewed.  Constitutional:      General: She is not  in acute distress.    Appearance: Normal appearance. She is well-developed. She is obese.  HENT:     Head: Normocephalic and atraumatic.     Right Ear: Hearing, tympanic membrane, ear canal and external ear normal. There is no impacted cerumen.     Left Ear: Hearing, tympanic membrane, ear canal and external ear normal. There is no impacted cerumen.     Nose:     Comments: Deferred - masked     Mouth/Throat:     Comments: Deferred - masked Eyes:     General: Lids are normal.     Conjunctiva/sclera: Conjunctivae normal.     Pupils: Pupils are equal, round, and reactive to light.     Funduscopic exam:    Right eye: No papilledema.        Left eye: No papilledema.  Neck:     Thyroid: No thyroid mass.     Vascular: No carotid bruit.  Cardiovascular:     Rate and Rhythm: Normal rate and regular rhythm.     Pulses: Normal pulses.     Heart sounds: Normal heart sounds. No murmur heard.   Pulmonary:     Effort: Pulmonary effort is normal. No respiratory distress.     Breath sounds: Normal breath sounds.  Abdominal:     General: Abdomen is flat. Bowel sounds are normal.     Palpations: Abdomen is soft.  Musculoskeletal:        General: No swelling. Normal range of motion.     Cervical back: Full passive range of motion without pain, normal range of motion and neck supple.     Right lower leg: No edema.     Left lower leg: No edema.  Skin:    General: Skin is warm and dry.     Capillary Refill: Capillary refill takes less than 2 seconds.  Neurological:     General: No focal deficit present.     Mental Status: She is alert and oriented to person, place, and time.     Cranial Nerves: No cranial nerve deficit.     Sensory: No sensory deficit.  Psychiatric:        Mood and Affect: Mood normal.        Behavior: Behavior normal.        Thought Content: Thought content normal.        Judgment: Judgment normal.         Assessment And Plan:     1. Health maintenance  examination . Behavior modifications discussed and diet history reviewed.   . Pt will continue to exercise regularly and modify diet with low GI, plant based foods and decrease intake of processed foods.  . Recommend intake of daily multivitamin, Vitamin D, and calcium.  . Recommend for preventive screenings, as well as recommend immunizations that includeTDAP and  Covid 19 (both are up to date) - Lipid panel  2. Class 1 obesity due to excess calories without serious comorbidity with body mass index (BMI) of 31.0 to 31.9 in adult Chronic Discussed healthy diet and regular exercise options  Encouraged to exercise at least 150 minutes per week with 2 days of strength training Will start [facility] pending insurance approval she is to titrate weekly, discussed side effects of nausea, abdominal pain or difficulty swallowing to notify office. [facility] teaching done, She is advised to stop eating when she begins feeling full  Return in 2 months for weight check. - Hemoglobin A1c - Insulin, random(561)  3. Migraine without aura and without status migrainosus, not intractable  Encouraged to take magnesium daily and stay well hydrated as well.   She is also taking Ubrelvy as needed  4. Dizziness  Intermittent dizziness, will check thyroid levels  Occurs mostly when changing positions. Orthostats are negative.  May be related to her migraines. - TSH  5. Encounter for hepatitis C screening test for low risk patient  Will check Hepatitis C screening due to recent recommendations to screen all adults 18 years and older - Hepatitis C antibody   [name], FNP    THE PATIENT IS ENCOURAGED TO PRACTICE SOCIAL DISTANCING DUE TO THE COVID-19 PANDEMIC.

## 2020-03-08 LAB — LIPID PANEL
Chol/HDL Ratio: 2.6 ratio (ref 0.0–4.4)
Cholesterol, Total: 195 mg/dL (ref 100–199)
HDL: 74 mg/dL (ref >39)
LDL Chol Calc (NIH): 108 mg/dL — ABNORMAL HIGH (ref 0–99)
Triglycerides: 70 mg/dL (ref 0–149)
VLDL Cholesterol Cal: 13 mg/dL (ref 5–40)

## 2020-03-08 LAB — HEPATITIS C ANTIBODY: Hep C Virus Ab: <0.1 s/co ratio (ref 0.0–0.9)

## 2020-03-08 LAB — INSULIN, RANDOM: INSULIN: 12.1 u[IU]/mL (ref 2.6–24.9)

## 2020-03-08 LAB — HEMOGLOBIN A1C
Est. average glucose Bld gHb Est-mCnc: 94 mg/dL
Hgb A1c MFr Bld: 4.9 % (ref 4.8–5.6)

## 2020-03-08 LAB — TSH: TSH: 1.49 u[IU]/mL (ref 0.450–4.500)

## 2020-03-11 ENCOUNTER — Other Ambulatory Visit: Payer: Self-pay

## 2020-03-11 MED ORDER — AMBULATORY NON FORMULARY MEDICATION: 0 refills | Status: DC

## 2020-03-21 ENCOUNTER — Telehealth: Payer: Self-pay

## 2020-03-21 NOTE — Telephone Encounter (Signed)
Pt advised to come get savings card for [employer]

## 2020-04-01 ENCOUNTER — Ambulatory Visit (INDEPENDENT_AMBULATORY_CARE_PROVIDER_SITE_OTHER): Payer: No Typology Code available for payment source | Admitting: Nurse Practitioner

## 2020-04-01 ENCOUNTER — Other Ambulatory Visit: Payer: Self-pay

## 2020-04-01 ENCOUNTER — Encounter: Payer: Self-pay | Admitting: Nurse Practitioner

## 2020-04-01 VITALS — BP 118/72 | HR 87 | Temp 98.0°F | Ht <= 58 in | Wt 146.8 lb

## 2020-04-01 DIAGNOSIS — E6609 Other obesity due to excess calories: Secondary | ICD-10-CM

## 2020-04-01 DIAGNOSIS — R202 Paresthesia of skin: Secondary | ICD-10-CM | POA: Diagnosis not present

## 2020-04-01 DIAGNOSIS — Z6831 Body mass index (BMI) 31.0-31.9, adult: Secondary | ICD-10-CM | POA: Diagnosis not present

## 2020-04-01 DIAGNOSIS — R200 Anesthesia of skin: Secondary | ICD-10-CM

## 2020-04-01 MED ORDER — WEGOVY 0.25 MG/0.5ML ~~LOC~~ SOAJ: 0.2500 mg | SUBCUTANEOUS | 0 refills | Status: DC

## 2020-04-01 NOTE — Patient Instructions (Signed)
·   Take stool softner every other day and eat bland foods when nauseated.   Continue regular exercise

## 2020-04-01 NOTE — Progress Notes (Signed)
This visit occurred during the SARS-CoV-2 public health emergency.  Safety protocols were in place, including screening questions prior to the visit, additional usage of staff PPE, and extensive cleaning of exam room while observing appropriate contact time as indicated for disinfecting solutions.  Subjective:     Patient ID: Carolyn Barnes , female    DOB: [birth year] , 34 y.o.   MRN: [id number]   Chief Complaint  Patient presents with  . Weight Check    HPI  Here for weight check the first 2 weeks was rough, was not hungry and some nausea.  She was having constipation. She was also having more GERD.   Wt Readings from Last 3 Encounters: 04/01/20 : 146 lb 12.8 oz (66.6 kg) 03/05/20 : 151 lb 12.8 oz (68.9 kg) 02/04/20 : 154 lb 9.6 oz (70.1 kg)  She has just started back exercising in the last week.  She has been having trouble getting.   Right hand with numbness and tingling to 4th and 5th fingers. Has been going on for some time and was recommended to have PT but did not go.      Past Medical History:  Diagnosis Date  . Anxiety   . Fatigue   . GERD (gastroesophageal reflux disease)   . History of hiatal hernia   . Indigestion   . Migraine headache   . Seasonal allergies   . Vitamin D deficiency disease      Family History  Problem Relation Age of Onset  . Heart disease Maternal Grandmother   . Diabetes Maternal Grandmother   . Healthy Mother   . Healthy Daughter   . Breast cancer Neg Hx   . Ovarian cancer Neg Hx   . Colon cancer Neg Hx      Current Outpatient Medications:  .  cyclobenzaprine (FLEXERIL) 10 MG tablet, Take 1 tablet (10 mg total) by mouth at bedtime. (Patient taking differently: Take 10 mg by mouth daily as needed for muscle spasms. ), Disp: 90 tablet, Rfl: 0 .  fluticasone (FLONASE) 50 MCG/ACT nasal spray, Place 2 sprays into both nostrils daily., Disp: 16 g, Rfl: 0 .  pantoprazole (PROTONIX) 20 MG tablet, TAKE 1 TABLET BY MOUTH DAILY (Patient  taking differently: Take 20 mg by mouth every morning. ), Disp: 90 tablet, Rfl: 1 .  Plecanatide (TRULANCE) 3 MG TABS, Take 1 tablet by mouth daily at 2 PM. (Patient taking differently: Take 3 mg by mouth daily at 2 PM. ), Disp: 90 tablet, Rfl: 3 .  Ubrogepant (UBRELVY) 50 MG TABS, Take 1 tablet by mouth daily., Disp: 10 tablet, Rfl: 1 .  Semaglutide-Weight Management (WEGOVY) 0.25 MG/0.5ML SOAJ, Inject 0.25 mg into the skin once a week., Disp: 2.24 mL, Rfl: 0   No Known Allergies   Review of Systems  Constitutional: Negative.   Respiratory: Negative.  Negative for cough.   Cardiovascular: Negative.  Negative for chest pain, palpitations and leg swelling.  Musculoskeletal:       Right fingers numbness and tingling.   Neurological: Negative for dizziness and headaches.  Psychiatric/Behavioral: Negative.      Today's Vitals   04/01/20 1614  BP: 118/72  Pulse: 87  Temp: 98 F (36.7 C)  TempSrc: Oral  Weight: 146 lb 12.8 oz (66.6 kg)  Height: 4' 9.4" (1.458 m)  PainSc: 0-No pain   Body mass index is 31.33 kg/m.   Objective:  Physical Exam Constitutional:      General: She is not in acute distress.  Appearance: Normal appearance. She is obese.  Cardiovascular:     Rate and Rhythm: Normal rate and regular rhythm.     Pulses: Normal pulses.     Heart sounds: Normal heart sounds. No murmur heard.   Pulmonary:     Effort: Pulmonary effort is normal. No respiratory distress.     Breath sounds: Normal breath sounds.  Skin:    Capillary Refill: Capillary refill takes less than 2 seconds.  Neurological:     General: No focal deficit present.     Mental Status: She is alert and oriented to person, place, and time.     Cranial Nerves: No cranial nerve deficit.  Psychiatric:        Mood and Affect: Mood normal.        Behavior: Behavior normal.        Thought Content: Thought content normal.        Judgment: Judgment normal.         Assessment And Plan:     1. Class 1  obesity due to excess calories without serious comorbidity with body mass index (BMI) of 31.0 to 31.9 in adult Comments: - tolerating wegovy fair, 6 lb wt loss - advised to avoid fatty foods, eat bland diet with nausea - pending approval will continue Wegovy - Semaglutide-Weight Management (WEGOVY) 0.25 MG/0.5ML SOAJ; Inject 0.25 mg into the skin once a week.  Dispense: 2.24 mL; Refill: 0  2. Numbness and tingling in right hand Comments: - negative phalen and tinel - rx for wrist/thumb splint for night time - will check EMG/NCV - NCV with EMG(electromyography); Future     Patient was given opportunity to ask questions. Patient verbalized understanding of the plan and was able to repeat key elements of the plan. All questions were answered to their satisfaction.  [name], FNP   I, [name], FNP, have reviewed all documentation for this visit. The documentation on 04/01/20 for the exam, diagnosis, procedures, and orders are all accurate and complete.   THE PATIENT IS ENCOURAGED TO PRACTICE SOCIAL DISTANCING DUE TO THE COVID-19 PANDEMIC.

## 2020-04-07 ENCOUNTER — Telehealth: Payer: Self-pay | Admitting: Obstetrics and Gynecology

## 2020-04-07 ENCOUNTER — Other Ambulatory Visit: Payer: Self-pay | Admitting: Obstetrics and Gynecology

## 2020-04-07 MED ORDER — FLUCONAZOLE 150 MG PO TABS: 150.0000 mg | ORAL_TABLET | Freq: Once | ORAL | 3 refills | Status: DC

## 2020-04-07 NOTE — Telephone Encounter (Signed)
Patient calls, thinks she may have a yeast infection.  Desires antibiotic to be called in to pharmacy.  Will send in Diflucan.    [name], MD [facility]

## 2020-04-21 ENCOUNTER — Other Ambulatory Visit: Payer: Self-pay

## 2020-04-21 ENCOUNTER — Ambulatory Visit
Admission: RE | Admit: 2020-04-21 | Discharge: 2020-04-21 | Disposition: A | Discharge status: Home / Self Care | Payer: No Typology Code available for payment source | Source: Ambulatory Visit | Attending: Emergency Medicine | Admitting: Emergency Medicine

## 2020-04-21 VITALS — BP 107/78 | HR 98 | Temp 98.0°F | Resp 16

## 2020-04-21 DIAGNOSIS — J029 Acute pharyngitis, unspecified: Secondary | ICD-10-CM | POA: Diagnosis not present

## 2020-04-21 LAB — POCT RAPID STREP A (OFFICE): Rapid Strep A Screen: NEGATIVE

## 2020-04-21 NOTE — ED Triage Notes (Signed)
Patient complains of a sore throat. Denies cough, fever, ShOB, congestion, chest pain, headache, body aches, chills, and ear pain.

## 2020-04-21 NOTE — Discharge Instructions (Addendum)
Your rapid strep test is negative.  A throat culture is pending; we will call you if it is positive requiring treatment.    Follow up with your primary care provider if your symptoms are not improving.

## 2020-04-21 NOTE — ED Provider Notes (Signed)
Carolyn Barnes    CSN: [id number] Arrival date & time: 04/21/20  1657      History   Chief Complaint Chief Complaint  Patient presents with  . Sore Throat    HPI Carolyn Barnes is a 34 y.o. female.   Patient presents with sore throat x2 days.  She denies other symptoms, including fever, chills, rash, cough, shortness of breath, abdominal pain, vomiting, diarrhea.  Treatment attempted at home with Tylenol.  The history is provided by the patient.    Past Medical History:  Diagnosis Date  . Anxiety   . Fatigue   . GERD (gastroesophageal reflux disease)   . History of hiatal hernia   . Indigestion   . Migraine headache   . Seasonal allergies   . Vitamin D deficiency disease     Patient Active Problem List   Diagnosis Date Noted  . Bilateral pes planus 05/01/2018  . Vitamin D deficiency 03/17/2018  . Knee pain 03/17/2018  . Major depressive disorder with single episode, in full remission ([place]) 03/17/2018  . Headache, migraine 09/02/2015  . Allergic rhinitis 09/02/2015  . Hiatal hernia with GERD without esophagitis 09/02/2015  . Chronic constipation 03/07/2015  . Short stature 03/07/2015    Past Surgical History:  Procedure Laterality Date  . DILATION AND CURETTAGE OF UTERUS  02/2013,10/2013  . ESOPHAGOGASTRODUODENOSCOPY  02/2014   Dr Allen Norris - small HH and gastritis  . XI ROBOTIC ASSISTED VENTRAL HERNIA N/A 09/04/2019   Procedure: XI ROBOTIC ASSISTED VENTRAL HERNIA;  Surgeon: Jules Husbands, MD;  Location: [facility] ORS;  Service: General;  Laterality: N/A;    OB History    Gravida  4   Para  2   Term  2   Preterm      AB  2   Living  2     SAB  2   TAB      Ectopic      Multiple      Live Births  2            Home Medications    Prior to Admission medications   Medication Sig Start Date End Date Taking? Authorizing Provider  cyclobenzaprine (FLEXERIL) 10 MG tablet Take 1 tablet (10 mg total) by mouth at bedtime. Patient taking  differently: Take 10 mg by mouth daily as needed for muscle spasms.  07/23/19   Glean Hess, MD  fluticasone (FLONASE) 50 MCG/ACT nasal spray Place 2 sprays into both nostrils daily. 11/20/18   Tenna Delaine D, PA-C  pantoprazole (PROTONIX) 20 MG tablet TAKE 1 TABLET BY MOUTH DAILY Patient taking differently: Take 20 mg by mouth every morning.  08/28/19   Glean Hess, MD  Plecanatide (TRULANCE) 3 MG TABS Take 1 tablet by mouth daily at 2 PM. Patient taking differently: Take 3 mg by mouth daily at 2 PM.  03/28/19   Glean Hess, MD  Semaglutide-Weight Management (WEGOVY) 0.25 MG/0.5ML SOAJ Inject 0.25 mg into the skin once a week. 04/01/20   [name], FNP  Ubrogepant (UBRELVY) 50 MG TABS Take 1 tablet by mouth daily. 02/04/20   [name], FNP    Family History Family History  Problem Relation Age of Onset  . Heart disease Maternal Grandmother   . Diabetes Maternal Grandmother   . Healthy Mother   . Healthy Daughter   . Breast cancer Neg Hx   . Ovarian cancer Neg Hx   . Colon cancer Neg Hx  Social History Social History   Tobacco Use  . Smoking status: Never Smoker  . Smokeless tobacco: Never Used  Vaping Use  . Vaping Use: Never used  Substance Use Topics  . Alcohol use: No  . Drug use: No     Allergies   Patient has no known allergies.   Review of Systems Review of Systems  Constitutional: Negative for chills and fever.  HENT: Positive for sore throat. Negative for ear pain.   Eyes: Negative for pain and visual disturbance.  Respiratory: Negative for cough and shortness of breath.   Cardiovascular: Negative for chest pain and palpitations.  Gastrointestinal: Negative for abdominal pain, diarrhea, nausea and vomiting.  Genitourinary: Negative for dysuria and hematuria.  Musculoskeletal: Negative for arthralgias and back pain.  Skin: Negative for color change and rash.  Neurological: Negative for seizures and syncope.  All other systems  reviewed and are negative.    Physical Exam Triage Vital Signs ED Triage Vitals  Enc Vitals Group     BP      Pulse      Resp      Temp      Temp src      SpO2      Weight      Height      Head Circumference      Peak Flow      Pain Score      Pain Loc      Pain Edu?      Excl. in [place]?    No data found.  Updated Vital Signs BP 107/78   Pulse 98   Temp 98 F (36.7 C)   Resp 16   LMP 04/16/2020 (Within Days)   SpO2 97%   Visual Acuity Right Eye Distance:   Left Eye Distance:   Bilateral Distance:    Right Eye Near:   Left Eye Near:    Bilateral Near:     Physical Exam Vitals and nursing note reviewed.  Constitutional:      General: She is not in acute distress.    Appearance: She is well-developed. She is not ill-appearing.  HENT:     Head: Normocephalic and atraumatic.     Right Ear: Tympanic membrane normal.     Left Ear: Tympanic membrane normal.     Nose: Nose normal.     Mouth/Throat:     Mouth: Mucous membranes are moist.     Pharynx: Uvula midline. Posterior oropharyngeal erythema present. No oropharyngeal exudate or uvula swelling.     Tonsils: 0 on the right. 0 on the left.  Eyes:     Conjunctiva/sclera: Conjunctivae normal.  Cardiovascular:     Rate and Rhythm: Normal rate and regular rhythm.     Heart sounds: No murmur heard.   Pulmonary:     Effort: Pulmonary effort is normal. No respiratory distress.     Breath sounds: Normal breath sounds.  Abdominal:     Palpations: Abdomen is soft.     Tenderness: There is no abdominal tenderness. There is no guarding or rebound.  Musculoskeletal:     Cervical back: Neck supple.  Skin:    General: Skin is warm and dry.     Findings: No rash.  Neurological:     General: No focal deficit present.     Mental Status: She is alert and oriented to person, place, and time.     Gait: Gait normal.  Psychiatric:        Mood  and Affect: Mood normal.        Behavior: Behavior normal.      UC  Treatments / Results  Labs (all labs ordered are listed, but only abnormal results are displayed) Labs Reviewed  CULTURE, GROUP A STREP [facility])  POCT RAPID STREP A (OFFICE)    EKG   Radiology No results found.  Procedures Procedures (including critical care time)  Medications Ordered in UC Medications - No data to display  Initial Impression / Assessment and Plan / UC Course  I have reviewed the triage vital signs and the nursing notes.  Pertinent labs & imaging results that were available during my care of the patient were reviewed by me and considered in my medical decision making (see chart for details).   Sore throat.  Rapid strep negative; throat culture pending.  Discussed symptomatic treatment with Tylenol or ibuprofen as needed.  Instructed her to follow-up with her PCP if her symptoms are not improving.  Patient agrees to plan of care.       Final Clinical Impressions(s) / UC Diagnoses   Final diagnoses:  Sore throat     Discharge Instructions     Your rapid strep test is negative.  A throat culture is pending; we will call you if it is positive requiring treatment.    Follow up with your primary care provider if your symptoms are not improving.       ED Prescriptions    None     PDMP not reviewed this encounter.   Sharion Balloon, NP 04/21/20 1737

## 2020-04-24 LAB — CULTURE, GROUP A STREP (THRC)

## 2020-06-03 ENCOUNTER — Encounter: Payer: Self-pay | Admitting: Nurse Practitioner

## 2020-06-03 ENCOUNTER — Other Ambulatory Visit: Payer: Self-pay

## 2020-06-03 ENCOUNTER — Ambulatory Visit (INDEPENDENT_AMBULATORY_CARE_PROVIDER_SITE_OTHER): Payer: No Typology Code available for payment source | Admitting: Nurse Practitioner

## 2020-06-03 VITALS — BP 116/80 | HR 79 | Temp 98.1°F | Ht <= 58 in | Wt 142.6 lb

## 2020-06-03 DIAGNOSIS — Z6830 Body mass index (BMI) 30.0-30.9, adult: Secondary | ICD-10-CM

## 2020-06-03 DIAGNOSIS — R200 Anesthesia of skin: Secondary | ICD-10-CM

## 2020-06-03 DIAGNOSIS — E6609 Other obesity due to excess calories: Secondary | ICD-10-CM

## 2020-06-03 DIAGNOSIS — R202 Paresthesia of skin: Secondary | ICD-10-CM

## 2020-06-03 NOTE — Progress Notes (Signed)
I,[name] as a Education administrator for [employer], FNP.,have documented all relevant documentation on the behalf of [name], FNP,as directed by  [name], FNP while in the presence of [name], [place]. This visit occurred during the SARS-CoV-2 public health emergency.  Safety protocols were in place, including screening questions prior to the visit, additional usage of staff PPE, and extensive cleaning of exam room while observing appropriate contact time as indicated for disinfecting solutions.  Subjective:     Patient ID: Carolyn Barnes , female    DOB: 01-02-1986 , 34 y.o.   MRN: [id number]   Chief Complaint  Patient presents with  . Weight Check    HPI  Patient here for weight check.  Wt Readings from Last 3 Encounters: 06/03/20 : 142 lb 9.6 oz (64.7 kg) 04/01/20 : 146 lb 12.8 oz (66.6 kg) 03/05/20 : 151 lb 12.8 oz (68.9 kg)  She has not been on [place] for 6 week and is not interested to continue, would like to try on her own. Was on back order. She has been on the fence about starting back on. She feels she was having worsening headaches, dizziness and nausea.  She is being mindful with what she is eating. No exercise right now.  She is up to 1 liter a day.  She is eating yogurt, she will eat out for lunch eating out will try to eat whole.  Dinner usually has protein and 2 veggies.      Past Medical History:  Diagnosis Date  . Anxiety   . Fatigue   . GERD (gastroesophageal reflux disease)   . History of hiatal hernia   . Indigestion   . Migraine headache   . Seasonal allergies   . Vitamin D deficiency disease      Family History  Problem Relation Age of Onset  . Heart disease Maternal Grandmother   . Diabetes Maternal Grandmother   . Healthy Mother   . Healthy Daughter   . Breast cancer Neg Hx   . Ovarian cancer Neg Hx   . Colon cancer Neg Hx      Current Outpatient Medications:  .  cyclobenzaprine (FLEXERIL) 10 MG tablet, Take 1 tablet (10  mg total) by mouth at bedtime. (Patient taking differently: Take 10 mg by mouth daily as needed for muscle spasms. ), Disp: 90 tablet, Rfl: 0 .  fluticasone (FLONASE) 50 MCG/ACT nasal spray, Place 2 sprays into both nostrils daily., Disp: 16 g, Rfl: 0 .  pantoprazole (PROTONIX) 20 MG tablet, TAKE 1 TABLET BY MOUTH DAILY (Patient taking differently: Take 20 mg by mouth every morning. ), Disp: 90 tablet, Rfl: 1 .  Plecanatide (TRULANCE) 3 MG TABS, Take 1 tablet by mouth daily at 2 PM. (Patient taking differently: Take 3 mg by mouth daily at 2 PM. ), Disp: 90 tablet, Rfl: 3 .  Ubrogepant (UBRELVY) 50 MG TABS, Take 1 tablet by mouth daily., Disp: 10 tablet, Rfl: 1 .  sertraline (ZOLOFT) 50 MG tablet, Take 1 tablet (50 mg total) by mouth daily., Disp: 90 tablet, Rfl: 0   No Known Allergies   Review of Systems  Constitutional: Negative.   Respiratory: Negative.  Negative for cough.   Cardiovascular: Negative.  Negative for chest pain, palpitations and leg swelling.  Neurological: Negative for dizziness and headaches.  Psychiatric/Behavioral: Negative.      Today's Vitals   06/03/20 1634  BP: 116/80  Pulse: 79  Temp: 98.1 F (36.7 C)  TempSrc: Oral  Weight: 142 lb 9.6 oz (64.7 kg)  Height: 4\' 9"  (1.448 m)  PainSc: 0-No pain   Body mass index is 30.86 kg/m.   Objective:  Physical Exam Vitals reviewed.  Constitutional:      General: She is not in acute distress.    Appearance: Normal appearance.  Cardiovascular:     Rate and Rhythm: Normal rate and regular rhythm.     Pulses: Normal pulses.     Heart sounds: Normal heart sounds. No murmur heard.   Pulmonary:     Effort: Pulmonary effort is normal. No respiratory distress.     Breath sounds: Normal breath sounds.  Neurological:     General: No focal deficit present.     Mental Status: She is alert and oriented to person, place, and time.     Cranial Nerves: No cranial nerve deficit.  Psychiatric:        Mood and Affect: Mood  normal.        Behavior: Behavior normal.        Thought Content: Thought content normal.        Judgment: Judgment normal.         Assessment And Plan:     1. Class 1 obesity due to excess calories with serious comorbidity and body mass index (BMI) of 30.0 to 30.9 in adult  She is no longer taking the [facility], she will try to exercise more and eat a healthy diet. She has had a 4 lb weight loss since her last visit. She would like to be referred to [facility] to help with her diet. Wt Readings from Last 3 Encounters:  06/03/20 142 lb 9.6 oz (64.7 kg)  04/01/20 146 lb 12.8 oz (66.6 kg)  03/05/20 151 lb 12.8 oz (68.9 kg)   - Amb Ref to Medical Weight Management  2. Numbness and tingling in right hand  Negative tinel and phalen's test  Will refer to hand specialist since this sensation has been ongoing for quite some time - Ambulatory referral to Hand Surgery     Patient was given opportunity to ask questions. Patient verbalized understanding of the plan and was able to repeat key elements of the plan. All questions were answered to their satisfaction.    Teola Bradley, FNP, have reviewed all documentation for this visit. The documentation on 06/17/20 for the exam, diagnosis, procedures, and orders are all accurate and complete.  THE PATIENT IS ENCOURAGED TO PRACTICE SOCIAL DISTANCING DUE TO THE COVID-19 PANDEMIC.

## 2020-06-03 NOTE — Patient Instructions (Signed)

## 2020-06-04 ENCOUNTER — Telehealth: Payer: Self-pay

## 2020-06-04 NOTE — Telephone Encounter (Signed)
Pt called to schedule her for annual exam. appointment made.

## 2020-06-10 ENCOUNTER — Other Ambulatory Visit: Payer: Self-pay | Admitting: Obstetrics and Gynecology

## 2020-06-10 MED ORDER — SERTRALINE HCL 50 MG PO TABS: 50.0000 mg | ORAL_TABLET | Freq: Every day | ORAL | 0 refills | Status: DC

## 2020-06-16 ENCOUNTER — Encounter: Payer: Self-pay | Admitting: Nurse Practitioner

## 2020-06-16 DIAGNOSIS — F909 Attention-deficit hyperactivity disorder, unspecified type: Secondary | ICD-10-CM | POA: Insufficient documentation

## 2020-06-17 ENCOUNTER — Encounter: Payer: Self-pay | Admitting: Nurse Practitioner

## 2020-07-23 ENCOUNTER — Encounter: Payer: Self-pay | Admitting: Nurse Practitioner

## 2020-07-25 ENCOUNTER — Ambulatory Visit (INDEPENDENT_AMBULATORY_CARE_PROVIDER_SITE_OTHER): Payer: No Typology Code available for payment source | Admitting: Obstetrics and Gynecology

## 2020-07-25 ENCOUNTER — Encounter: Payer: Self-pay | Admitting: Obstetrics and Gynecology

## 2020-07-25 ENCOUNTER — Other Ambulatory Visit: Payer: Self-pay

## 2020-07-25 ENCOUNTER — Other Ambulatory Visit (HOSPITAL_COMMUNITY)
Admission: RE | Admit: 2020-07-25 | Discharge: 2020-07-25 | Disposition: A | Discharge status: Home / Self Care | Payer: No Typology Code available for payment source | Source: Ambulatory Visit | Attending: Obstetrics and Gynecology | Admitting: Obstetrics and Gynecology

## 2020-07-25 VITALS — BP 109/71 | HR 86 | Ht <= 58 in | Wt 143.2 lb

## 2020-07-25 DIAGNOSIS — Z124 Encounter for screening for malignant neoplasm of cervix: Secondary | ICD-10-CM | POA: Diagnosis not present

## 2020-07-25 DIAGNOSIS — Z01419 Encounter for gynecological examination (general) (routine) without abnormal findings: Secondary | ICD-10-CM

## 2020-07-25 DIAGNOSIS — R638 Other symptoms and signs concerning food and fluid intake: Secondary | ICD-10-CM | POA: Diagnosis not present

## 2020-07-25 DIAGNOSIS — F3281 Premenstrual dysphoric disorder: Secondary | ICD-10-CM | POA: Diagnosis not present

## 2020-07-25 NOTE — Progress Notes (Signed)
GYNECOLOGY ANNUAL PHYSICAL EXAM PROGRESS NOTE  Subjective:    Carolyn Barnes is a 34 y.o. G46P1021 married female who presents for an annual exam. The patient has no complaints today. She is sexually active. The patient wears seatbelts: yes.   Of note, patient was initiated on Zoloft 1 month ago for management of suspected PMDD symptoms.  She takes the medication the week prior to her cycle and few days into the cycle.   Notes that the medication is working very well for her.    Menstrual History: Menarche age: 48 Patient's last menstrual period was 07/03/2020. Period Duration (Days): 5 Period Pattern: Regular Menstrual Flow: Moderate Menstrual Control: Maxi pad Menstrual Control Change Freq (Hours): 2-3 Dysmenorrhea: (!) Moderate Dysmenorrhea Symptoms: Cramping, Headache, Diarrhea, Nausea    Gynecologic History: Patient's last menstrual period was 07/03/2020. Contraception: vasectomy History of STI's: Denies Last Pap: 07/20/2017. Results were: normal.  Denies h/o abnormal pap smears.    Upstream - 07/25/20 1513      Pregnancy Intention Screening   Does the patient want to become pregnant in the next year? No    Does the patient's partner want to become pregnant in the next year? No    Would the patient like to discuss contraceptive options today? No      Contraception Wrap Up   Current Method Vasectomy          The pregnancy intention screening data noted above was reviewed. Potential methods of contraception were not discussed. The patient will continue use with Vasectomy.    OB History  Gravida Para Term Preterm AB Living  4 2 2  0 2 2  SAB TAB Ectopic Multiple Live Births  2 0 0 0 2    # Outcome Date GA Lbr Len/2nd Weight Sex Delivery Anes PTL Lv  4 Term 05/29/15 [redacted]w[redacted]d  7 lb 8.3 oz (3.41 kg) F Vag-Spont  Y LIV  3 SAB 2015     SAB   FD  2 SAB 2014     SAB   FD  1 Term 2010 [redacted]w[redacted]d  6 lb 2 oz (2.778 kg) F Vag-Spont  N LIV     Name: Carolyn Barnes    Past  Medical History:  Diagnosis Date  . Anxiety   . Fatigue   . GERD (gastroesophageal reflux disease)   . History of hiatal hernia   . Indigestion   . Migraine headache   . Seasonal allergies   . Vitamin D deficiency disease     Past Surgical History:  Procedure Laterality Date  . DILATION AND CURETTAGE OF UTERUS  02/2013,10/2013  . ESOPHAGOGASTRODUODENOSCOPY  02/2014   Dr Allen Norris - small HH and gastritis  . XI ROBOTIC ASSISTED VENTRAL HERNIA N/A 09/04/2019   Procedure: XI ROBOTIC ASSISTED VENTRAL HERNIA;  Surgeon: Jules Husbands, MD;  Location: [facility] ORS;  Service: General;  Laterality: N/A;    Family History  Problem Relation Age of Onset  . Heart disease Maternal Grandmother   . Diabetes Maternal Grandmother   . Healthy Mother   . Healthy Daughter   . Breast cancer Neg Hx   . Ovarian cancer Neg Hx   . Colon cancer Neg Hx     Social History   Socioeconomic History  . Marital status: Married    Spouse name: Not on file  . Number of children: 2  . Years of education: Not on file  . Highest education level: Not on file  Occupational History  .  Not on file  Tobacco Use  . Smoking status: Never Smoker  . Smokeless tobacco: Never Used  Vaping Use  . Vaping Use: Never used  Substance and Sexual Activity  . Alcohol use: No  . Drug use: No  . Sexual activity: Yes    Birth control/protection: None, Other-see comments    Comment: Husband-vasectomy  Other Topics Concern  . Not on file  Social History Narrative  . Not on file   Social Determinants of Health   Financial Resource Strain:   . Difficulty of Paying Living Expenses: Not on file  Food Insecurity:   . Worried About Charity fundraiser in the Last Year: Not on file  . Ran Out of Food in the Last Year: Not on file  Transportation Needs:   . Lack of Transportation (Medical): Not on file  . Lack of Transportation (Non-Medical): Not on file  Physical Activity:   . Days of Exercise per Week: Not on file  . Minutes  of Exercise per Session: Not on file  Stress:   . Feeling of Stress : Not on file  Social Connections:   . Frequency of Communication with Friends and Family: Not on file  . Frequency of Social Gatherings with Friends and Family: Not on file  . Attends Religious Services: Not on file  . Active Member of Clubs or Organizations: Not on file  . Attends Archivist Meetings: Not on file  . Marital Status: Not on file  Intimate Partner Violence:   . Fear of Current or Ex-Partner: Not on file  . Emotionally Abused: Not on file  . Physically Abused: Not on file  . Sexually Abused: Not on file    Current Outpatient Medications on File Prior to Visit  Medication Sig Dispense Refill  . fluticasone (FLONASE) 50 MCG/ACT nasal spray Place 2 sprays into both nostrils daily. 16 g 0  . pantoprazole (PROTONIX) 20 MG tablet TAKE 1 TABLET BY MOUTH DAILY (Patient taking differently: Take 20 mg by mouth every morning. ) 90 tablet 1  . Plecanatide (TRULANCE) 3 MG TABS Take 1 tablet by mouth daily at 2 PM. 90 tablet 3  . sertraline (ZOLOFT) 50 MG tablet Take 1 tablet (50 mg total) by mouth daily. (Patient taking differently: Take 50 mg by mouth daily. Day 21 of cycle) 90 tablet 0  . Ubrogepant (UBRELVY) 50 MG TABS Take 1 tablet by mouth daily. 10 tablet 1   No current facility-administered medications on file prior to visit.    No Known Allergies   Review of Systems Constitutional: negative for chills, fatigue, fevers and sweats Eyes: negative for irritation, redness and visual disturbance Ears, nose, mouth, throat, and face: negative for hearing loss, nasal congestion, snoring and tinnitus Respiratory: negative for asthma, cough, sputum Cardiovascular: negative for chest pain, dyspnea, exertional chest pressure/discomfort, irregular heart beat, palpitations and syncope Gastrointestinal: negative for abdominal pain, change in bowel habits, nausea and vomiting Genitourinary: negative for  abnormal menstrual periods. Negative for genital lesions, sexual problems and vaginal discharge, dysuria and urinary incontinence.  Integument/breast: negative for breast lump, breast tenderness and nipple discharge Hematologic/lymphatic: negative for bleeding and easy bruising Musculoskeletal:negative for back pain and muscle weakness Neurological: negative for dizziness, headaches, vertigo and weakness Endocrine: negative for diabetic symptoms including polydipsia, polyuria and skin dryness Allergic/Immunologic: negative for hay fever and urticaria       Objective:  Blood pressure 109/71, pulse 86, height 4\' 9"  (1.448 m), weight 143 lb 3.2 oz (  65 kg), last menstrual period 07/03/2020. Body mass index is 30.99 kg/m.  General Appearance:    Alert, cooperative, no distress, appears stated age, mild obesity  Head:    Normocephalic, without obvious abnormality, atraumatic  Eyes:    PERRL, conjunctiva/corneas clear, EOM's intact, both eyes  Ears:    Normal external ear canals, both ears  Nose:   Nares normal, septum midline, mucosa normal, no drainage or sinus tenderness  Throat:   Lips, mucosa, and tongue normal; teeth and gums normal  Neck:   Supple, symmetrical, trachea midline, no adenopathy; thyroid: no enlargement/tenderness/nodules; no carotid bruit or JVD  Back:     Symmetric, no curvature, ROM normal, no CVA tenderness  Lungs:     Clear to auscultation bilaterally, respirations unlabored  Chest Wall:    No tenderness or deformity   Heart:    Regular rate and rhythm, S1 and S2 normal, no murmur, rub or gallop  Breast Exam:    No tenderness, masses, or nipple abnormality  Abdomen:     Soft, non-tender, bowel sounds active all four quadrants, no masses, no organomegaly.    Genitalia:    Pelvic:external genitalia normal, vagina without lesions, discharge, or tenderness, rectovaginal septum  normal. Cervix normal in appearance, no cervical motion tenderness, no adnexal masses or tenderness.   Uterus normal size, shape, mobile, regular contours, nontender.  Rectal:    Normal external sphincter.  No hemorrhoids appreciated. Internal exam not done.   Extremities:   Extremities normal, atraumatic, no cyanosis or edema  Pulses:   2+ and symmetric all extremities  Skin:   Skin color, texture, turgor normal, no rashes or lesions  Lymph nodes:   Cervical, supraclavicular, and axillary nodes normal  Neurologic:   CNII-XII intact, normal strength, sensation and reflexes throughout   .  Labs:  Lab Results  Component Value Date   WBC 5.9 02/04/2020   HGB 12.9 02/04/2020   HCT 38.5 02/04/2020   MCV 93 02/04/2020   PLT 272 02/04/2020    Lab Results  Component Value Date   CREATININE 0.81 02/04/2020   BUN 7 02/04/2020   NA 138 02/04/2020   K 4.0 02/04/2020   CL 105 02/04/2020   CO2 22 02/04/2020    Lab Results  Component Value Date   ALT 11 02/04/2020   AST 18 02/04/2020   ALKPHOS 48 02/04/2020   BILITOT 0.3 02/04/2020    Lab Results  Component Value Date   TSH 1.490 03/07/2020     Assessment:   Healthy female exam. PMDD Increased BMI  Plan:     Blood tests: Labs performed by PCP. Up to date.  Breast self exam technique reviewed and patient encouraged to perform self-exam monthly. Contraception: vasectomy. Discussed healthy lifestyle modifications. Was previously on medical weight loss management with [facility] however discontinued due to undesirable affects. Is currently attempting to manage with modifying diet and will be increasing physical activity.  Pap smear performed today. Has received flu vaccine at work ([employer]).  COVID vaccination status: Has completed vaccination series.  Continue use of Zoloft for PMDD symptoms, currently working well.    [name], MD [facility]

## 2020-07-25 NOTE — Patient Instructions (Addendum)
Preventive Care 21-33 Years Old, Female Preventive care refers to visits with your health care provider and lifestyle choices that can promote health and wellness. This includes:  A yearly physical exam. This may also be called an annual well check.  Regular dental visits and eye exams.  Immunizations.  Screening for certain conditions.  Healthy lifestyle choices, such as eating a healthy diet, getting regular exercise, not using drugs or products that contain nicotine and tobacco, and limiting alcohol use. What can I expect for my preventive care visit? Physical exam Your health care provider will check your:  Height and weight. This may be used to calculate body mass index (BMI), which tells if you are at a healthy weight.  Heart rate and blood pressure.  Skin for abnormal spots. Counseling Your health care provider may ask you questions about your:  Alcohol, tobacco, and drug use.  Emotional well-being.  Home and relationship well-being.  Sexual activity.  Eating habits.  Work and work environment.  Method of birth control.  Menstrual cycle.  Pregnancy history. What immunizations do I need?  Influenza (flu) vaccine  This is recommended every year. Tetanus, diphtheria, and pertussis (Tdap) vaccine  You may need a Td booster every 10 years. Varicella (chickenpox) vaccine  You may need this if you have not been vaccinated. Human papillomavirus (HPV) vaccine  If recommended by your health care provider, you may need three doses over 6 months. Measles, mumps, and rubella (MMR) vaccine  You may need at least one dose of MMR. You may also need a second dose. Meningococcal conjugate (MenACWY) vaccine  One dose is recommended if you are age 19-21 years and a first-year college student living in a residence hall, or if you have one of several medical conditions. You may also need additional booster doses. Pneumococcal conjugate (PCV13) vaccine  You may need  this if you have certain conditions and were not previously vaccinated. Pneumococcal polysaccharide (PPSV23) vaccine  You may need one or two doses if you smoke cigarettes or if you have certain conditions. Hepatitis A vaccine  You may need this if you have certain conditions or if you travel or work in places where you may be exposed to hepatitis A. Hepatitis B vaccine  You may need this if you have certain conditions or if you travel or work in places where you may be exposed to hepatitis B. Haemophilus influenzae type b (Hib) vaccine  You may need this if you have certain conditions. You may receive vaccines as individual doses or as more than one vaccine together in one shot (combination vaccines). Talk with your health care provider about the risks and benefits of combination vaccines. What tests do I need?  Blood tests  Lipid and cholesterol levels. These may be checked every 5 years starting at age 20.  Hepatitis C test.  Hepatitis B test. Screening  Diabetes screening. This is done by checking your blood sugar (glucose) after you have not eaten for a while (fasting).  Sexually transmitted disease (STD) testing.  BRCA-related cancer screening. This may be done if you have a family history of breast, ovarian, tubal, or peritoneal cancers.  Pelvic exam and Pap test. This may be done every 3 years starting at age 21. Starting at age 30, this may be done every 5 years if you have a Pap test in combination with an HPV test. Talk with your health care provider about your test results, treatment options, and if necessary, the need for more tests.   Follow these instructions at home: Eating and drinking   Eat a diet that includes fresh fruits and vegetables, whole grains, lean protein, and low-fat dairy.  Take vitamin and mineral supplements as recommended by your health care provider.  Do not drink alcohol if: ? Your health care provider tells you not to drink. ? You are  pregnant, may be pregnant, or are planning to become pregnant.  If you drink alcohol: ? Limit how much you have to 0-1 drink a day. ? Be aware of how much alcohol is in your drink. In the [place] one drink equals one 12 oz bottle of beer (355 mL), one 5 oz glass of wine (148 mL), or one 1 oz glass of hard liquor (44 mL). Lifestyle  Take daily care of your teeth and gums.  Stay active. Exercise for at least 30 minutes on 5 or more days each week.  Do not use any products that contain nicotine or tobacco, such as cigarettes, e-cigarettes, and chewing tobacco. If you need help quitting, ask your health care provider.  If you are sexually active, practice safe sex. Use a condom or other form of birth control (contraception) in order to prevent pregnancy and STIs (sexually transmitted infections). If you plan to become pregnant, see your health care provider for a preconception visit. What's next?  Visit your health care provider once a year for a well check visit.  Ask your health care provider how often you should have your eyes and teeth checked.  Stay up to date on all vaccines. This information is not intended to replace advice given to you by your health care provider. Make sure you discuss any questions you have with your health care provider. Document Revised: 05/11/2018 Document Reviewed: 05/11/2018 Elsevier Patient Education  2020 [employer] Breast Self-Awareness Breast self-awareness is knowing how your breasts look and feel. Doing breast self-awareness is important. It allows you to catch a breast problem early while it is still small and can be treated. All women should do breast self-awareness, including women who have had breast implants. Tell your doctor if you notice a change in your breasts. What you need:  A mirror.  A well-lit room. How to do a breast self-exam A breast self-exam is one way to learn what is normal for your breasts and to check for changes. To do a  breast self-exam: Look for changes  1. Take off all the clothes above your waist. 2. Stand in front of a mirror in a room with good lighting. 3. Put your hands on your hips. 4. Push your hands down. 5. Look at your breasts and nipples in the mirror to see if one breast or nipple looks different from the other. Check to see if: ? The shape of one breast is different. ? The size of one breast is different. ? There are wrinkles, dips, and bumps in one breast and not the other. 6. Look at each breast for changes in the skin, such as: ? Redness. ? Scaly areas. 7. Look for changes in your nipples, such as: ? Liquid around the nipples. ? Bleeding. ? Dimpling. ? Redness. ? A change in where the nipples are. Feel for changes  1. Lie on your back on the floor. 2. Feel each breast. To do this, follow these steps: ? Pick a breast to feel. ? Put the arm closest to that breast above your head. ? Use your other arm to feel the nipple area of your breast. Feel   the area with the pads of your three middle fingers by making small circles with your fingers. For the first circle, press lightly. For the second circle, press harder. For the third circle, press even harder. ? Keep making circles with your fingers at the different pressures as you move down your breast. Stop when you feel your ribs. ? Move your fingers a little toward the center of your body. ? Start making circles with your fingers again, this time going up until you reach your collarbone. ? Keep making up-and-down circles until you reach your armpit. Remember to keep using the three pressures. ? Feel the other breast in the same way. 3. Sit or stand in the tub or shower. 4. With soapy water on your skin, feel each breast the same way you did in step 2 when you were lying on the floor. Write down what you find Writing down what you find can help you remember what to tell your doctor. Write down:  What is normal for each breast.  Any  changes you find in each breast, including: ? The kind of changes you find. ? Whether you have pain. ? Size and location of any lumps.  When you last had your menstrual period. General tips  Check your breasts every month.  If you are breastfeeding, the best time to check your breasts is after you feed your baby or after you use a breast pump.  If you get menstrual periods, the best time to check your breasts is 5-7 days after your menstrual period is over.  With time, you will become comfortable with the self-exam, and you will begin to know if there are changes in your breasts. Contact a doctor if you:  See a change in the shape or size of your breasts or nipples.  See a change in the skin of your breast or nipples, such as red or scaly skin.  Have fluid coming from your nipples that is not normal.  Find a lump or thick area that was not there before.  Have pain in your breasts.  Have any concerns about your breast health. Summary  Breast self-awareness includes looking for changes in your breasts, as well as feeling for changes within your breasts.  Breast self-awareness should be done in front of a mirror in a well-lit room.  You should check your breasts every month. If you get menstrual periods, the best time to check your breasts is 5-7 days after your menstrual period is over.  Let your doctor know of any changes you see in your breasts, including changes in size, changes on the skin, pain or tenderness, or fluid from your nipples that is not normal. This information is not intended to replace advice given to you by your health care provider. Make sure you discuss any questions you have with your health care provider. Document Revised: 04/18/2018 Document Reviewed: 04/18/2018 Elsevier Patient Education  [place] Maintenance, Female Adopting a healthy lifestyle and getting preventive care are important in promoting health and wellness. Ask your  health care provider about:  The right schedule for you to have regular tests and exams.  Things you can do on your own to prevent diseases and keep yourself healthy. What should I know about diet, weight, and exercise? Eat a healthy diet   Eat a diet that includes plenty of vegetables, fruits, low-fat dairy products, and lean protein.  Do not eat a lot of foods that are high in solid  fats, added sugars, or sodium. Maintain a healthy weight Body mass index (BMI) is used to identify weight problems. It estimates body fat based on height and weight. Your health care provider can help determine your BMI and help you achieve or maintain a healthy weight. Get regular exercise Get regular exercise. This is one of the most important things you can do for your health. Most adults should:  Exercise for at least 150 minutes each week. The exercise should increase your heart rate and make you sweat (moderate-intensity exercise).  Do strengthening exercises at least twice a week. This is in addition to the moderate-intensity exercise.  Spend less time sitting. Even light physical activity can be beneficial. Watch cholesterol and blood lipids Have your blood tested for lipids and cholesterol at 33 years of age, then have this test every 5 years. Have your cholesterol levels checked more often if:  Your lipid or cholesterol levels are high.  You are older than 33 years of age.  You are at high risk for heart disease. What should I know about cancer screening? Depending on your health history and family history, you may need to have cancer screening at various ages. This may include screening for:  Breast cancer.  Cervical cancer.  Colorectal cancer.  Skin cancer.  Lung cancer. What should I know about heart disease, diabetes, and high blood pressure? Blood pressure and heart disease  High blood pressure causes heart disease and increases the risk of stroke. This is more likely to  develop in people who have high blood pressure readings, are of African descent, or are overweight.  Have your blood pressure checked: ? Every 3-5 years if you are 6-64 years of age. ? Every year if you are 78 years old or older. Diabetes Have regular diabetes screenings. This checks your fasting blood sugar level. Have the screening done:  Once every three years after age 27 if you are at a normal weight and have a low risk for diabetes.  More often and at a younger age if you are overweight or have a high risk for diabetes. What should I know about preventing infection? Hepatitis B If you have a higher risk for hepatitis B, you should be screened for this virus. Talk with your health care provider to find out if you are at risk for hepatitis B infection. Hepatitis C Testing is recommended for:  Everyone born from [birth year] through 1964.  Anyone with known risk factors for hepatitis C. Sexually transmitted infections (STIs)  Get screened for STIs, including gonorrhea and chlamydia, if: ? You are sexually active and are younger than 33 years of age. ? You are older than 33 years of age and your health care provider tells you that you are at risk for this type of infection. ? Your sexual activity has changed since you were last screened, and you are at increased risk for chlamydia or gonorrhea. Ask your health care provider if you are at risk.  Ask your health care provider about whether you are at high risk for HIV. Your health care provider may recommend a prescription medicine to help prevent HIV infection. If you choose to take medicine to prevent HIV, you should first get tested for HIV. You should then be tested every 3 months for as long as you are taking the medicine. Pregnancy  If you are about to stop having your period (premenopausal) and you may become pregnant, seek counseling before you get pregnant.  Take 400 to 800  micrograms (mcg) of folic acid every day if you become  pregnant.  Ask for birth control (contraception) if you want to prevent pregnancy. Osteoporosis and menopause Osteoporosis is a disease in which the bones lose minerals and strength with aging. This can result in bone fractures. If you are 33 years old or older, or if you are at risk for osteoporosis and fractures, ask your health care provider if you should:  Be screened for bone loss.  Take a calcium or vitamin D supplement to lower your risk of fractures.  Be given hormone replacement therapy (HRT) to treat symptoms of menopause. Follow these instructions at home: Lifestyle  Do not use any products that contain nicotine or tobacco, such as cigarettes, e-cigarettes, and chewing tobacco. If you need help quitting, ask your health care provider.  Do not use street drugs.  Do not share needles.  Ask your health care provider for help if you need support or information about quitting drugs. Alcohol use  Do not drink alcohol if: ? Your health care provider tells you not to drink. ? You are pregnant, may be pregnant, or are planning to become pregnant.  If you drink alcohol: ? Limit how much you use to 0-1 drink a day. ? Limit intake if you are breastfeeding.  Be aware of how much alcohol is in your drink. In the [place] one drink equals one 12 oz bottle of beer (355 mL), one 5 oz glass of wine (148 mL), or one 1 oz glass of hard liquor (44 mL). General instructions  Schedule regular health, dental, and eye exams.  Stay current with your vaccines.  Tell your health care provider if: ? You often feel depressed. ? You have ever been abused or do not feel safe at home. Summary  Adopting a healthy lifestyle and getting preventive care are important in promoting health and wellness.  Follow your health care provider's instructions about healthy diet, exercising, and getting tested or screened for diseases.  Follow your health care provider's instructions on monitoring your  cholesterol and blood pressure. This information is not intended to replace advice given to you by your health care provider. Make sure you discuss any questions you have with your health care provider. Document Revised: 08/23/2018 Document Reviewed: 08/23/2018 Elsevier Patient Education  2020 [place] Breast self-awareness means being familiar with how your breasts look and feel. It involves checking your breasts regularly and reporting any changes to your health care provider. Practicing breast self-awareness is important. Sometimes changes may not be harmful (are benign), but sometimes a change in your breasts can be a sign of a serious medical problem. It is important to learn how to do this procedure correctly so that you can catch problems early, when treatment is more likely to be successful. All women should practice breast self-awareness, including women who have had breast implants. What you need:  A mirror.  A well-lit room. How to do a breast self-exam A breast self-exam is one way to learn what is normal for your breasts and whether your breasts are changing. To do a breast self-exam: Look for changes  1. Remove all the clothing above your waist. 2. Stand in front of a mirror in a room with good lighting. 3. Put your hands on your hips. 4. Push your hands firmly downward. 5. Compare your breasts in the mirror. Look for differences between them (asymmetry), such as: ? Differences in shape. ? Differences in size. ? Puckers,  dips, and bumps in one breast and not the other. 6. Look at each breast for changes in the skin, such as: ? Redness. ? Scaly areas. 7. Look for changes in your nipples, such as: ? Discharge. ? Bleeding. ? Dimpling. ? Redness. ? A change in position. Feel for changes Carefully feel your breasts for lumps and changes. It is best to do this while lying on your back on the floor, and again while sitting or standing in the  tub or shower with soapy water on your skin. Feel each breast in the following way: 1. Place the arm on the side of the breast you are examining above your head. 2. Feel your breast with the other hand. 3. Start in the nipple area and make -inch (2 cm) overlapping circles to feel your breast. Use the pads of your three middle fingers to do this. Apply light pressure, then medium pressure, then firm pressure. The light pressure will allow you to feel the tissue closest to the skin. The medium pressure will allow you to feel the tissue that is a little deeper. The firm pressure will allow you to feel the tissue close to the ribs. 4. Continue the overlapping circles, moving downward over the breast until you feel your ribs below your breast. 5. Move one finger-width toward the center of the body. Continue to use the -inch (2 cm) overlapping circles to feel your breast as you move slowly up toward your collarbone. 6. Continue the up-and-down exam using all three pressures until you reach your armpit.  Write down what you find Writing down what you find can help you remember what to discuss with your health care provider. Write down:  What is normal for each breast.  Any changes that you find in each breast, including: ? The kind of changes you find. ? Any pain or tenderness. ? Size and location of any lumps.  Where you are in your menstrual cycle, if you are still menstruating. General tips and recommendations  Examine your breasts every month.  If you are breastfeeding, the best time to examine your breasts is after a feeding or after using a breast pump.  If you menstruate, the best time to examine your breasts is 5-7 days after your period. Breasts are generally lumpier during menstrual periods, and it may be more difficult to notice changes.  With time and practice, you will become more familiar with the variations in your breasts and more comfortable with the exam. Contact a health  care provider if you:  See a change in the shape or size of your breasts or nipples.  See a change in the skin of your breast or nipples, such as a reddened or scaly area.  Have unusual discharge from your nipples.  Find a lump or thick area that was not there before.  Have pain in your breasts.  Have any concerns related to your breast health. Summary  Breast self-awareness includes looking for physical changes in your breasts, as well as feeling for any changes within your breasts.  Breast self-awareness should be performed in front of a mirror in a well-lit room.  You should examine your breasts every month. If you menstruate, the best time to examine your breasts is 5-7 days after your menstrual period.  Let your health care provider know of any changes you notice in your breasts, including changes in size, changes on the skin, pain or tenderness, or unusual fluid from your nipples. This information is not  intended to replace advice given to you by your health care provider. Make sure you discuss any questions you have with your health care provider. Document Revised: 04/18/2018 Document Reviewed: 04/18/2018 Elsevier Patient Education  [place].

## 2020-07-25 NOTE — Progress Notes (Signed)
Pt present for annual exam. Pt stated that she was doing well denies any gyn issues at this time.

## 2020-07-26 ENCOUNTER — Encounter: Payer: Self-pay | Admitting: Obstetrics and Gynecology

## 2020-07-26 DIAGNOSIS — F3281 Premenstrual dysphoric disorder: Secondary | ICD-10-CM | POA: Insufficient documentation

## 2020-08-05 ENCOUNTER — Ambulatory Visit: Payer: No Typology Code available for payment source | Admitting: Nurse Practitioner

## 2020-08-06 LAB — CYTOLOGY - PAP
Comment: NEGATIVE
Diagnosis: UNDETERMINED — AB
High risk HPV: NEGATIVE

## 2020-08-13 DIAGNOSIS — H9311 Tinnitus, right ear: Secondary | ICD-10-CM

## 2020-09-08 ENCOUNTER — Other Ambulatory Visit: Payer: Self-pay | Admitting: Otolaryngology

## 2020-09-23 ENCOUNTER — Ambulatory Visit: Payer: No Typology Code available for payment source | Admitting: Nurse Practitioner

## 2020-09-25 ENCOUNTER — Telehealth: Payer: No Typology Code available for payment source | Admitting: Physician Assistant

## 2020-09-25 ENCOUNTER — Other Ambulatory Visit: Payer: Self-pay | Admitting: Physician Assistant

## 2020-09-25 DIAGNOSIS — S161XXA Strain of muscle, fascia and tendon at neck level, initial encounter: Secondary | ICD-10-CM | POA: Diagnosis not present

## 2020-09-25 MED ORDER — NAPROXEN 500 MG PO TABS: 500.0000 mg | ORAL_TABLET | Freq: Two times a day (BID) | ORAL | 0 refills | Status: DC

## 2020-09-25 MED ORDER — BACLOFEN 10 MG PO TABS: 10.0000 mg | ORAL_TABLET | Freq: Three times a day (TID) | ORAL | 0 refills | Status: DC

## 2020-09-25 NOTE — Progress Notes (Signed)
Hi [name],  Thank you for the details you included in the comment boxes. Those details are very helpful in determining the best course of treatment for you and help [place] to provide the best care.  We are sorry that you are not feeling well.  Here is how we plan to help!  Based on what you have shared with me it looks like you mostly have acute neck pain.  Acute back pain is defined as musculoskeletal pain that can resolve in 1-3 weeks with conservative treatment.  I have prescribed Naprosyn 500 mg take one by mouth twice a day non-steroid anti-inflammatory (NSAID) as well as Baclofen 10 mg every eight hours as needed which is a muscle relaxer  Some patients experience stomach irritation or in increased heartburn with anti-inflammatory drugs.  Please keep in mind that muscle relaxer's can cause fatigue and should not be taken while at work or driving.  Back pain is very common.  The pain often gets better over time.  The cause of back pain is usually not dangerous.  Most people can learn to manage their back pain on their own.  Home Care  Stay active.  Start with short walks on flat ground if you can.  Try to walk farther each day.  Do not sit, drive or stand in one place for more than 30 minutes.  Do not stay in bed.  Do not avoid exercise or work.  Activity can help your back heal faster.  Be careful when you bend or lift an object.  Bend at your knees, keep the object close to you, and do not twist.  Sleep on a firm mattress.  Lie on your side, and bend your knees.  If you lie on your back, put a pillow under your knees.  Only take medicines as told by your doctor.  Put ice on the injured area.  Put ice in a plastic bag  Place a towel between your skin and the bag  Leave the ice on for 15-20 minutes, 3-4 times a day for the first 2-3 days. 210 After that, you can switch between ice and heat packs.  Ask your doctor about back exercises or massage.  Avoid feeling anxious or  stressed.  Find good ways to deal with stress, such as exercise.  Get Help Right Way If:  Your pain does not go away with rest or medicine.  Your pain does not go away in 1 week.  You have new problems.  You do not feel well.  The pain spreads into your legs.  You cannot control when you poop (bowel movement) or pee (urinate)  You feel sick to your stomach (nauseous) or throw up (vomit)  You have belly (abdominal) pain.  You feel like you may pass out (faint).  If you develop a fever.  Make Sure you:  Understand these instructions.  Will watch your condition  Will get help right away if you are not doing well or get worse.  Your e-visit answers were reviewed by a board certified advanced clinical practitioner to complete your personal care plan.  Depending on the condition, your plan could have included both over the counter or prescription medications.  If there is a problem please reply  once you have received a response from your provider.  Your safety is important to [place].  If you have drug allergies check your prescription carefully.    You can use MyChart to ask questions about today's visit, request a non-urgent  call back, or ask for a work or school excuse for 24 hours related to this e-Visit. If it has been greater than 24 hours you will need to follow up with your provider, or enter a new e-Visit to address those concerns.  You will get an e-mail in the next two days asking about your experience.  I hope that your e-visit has been valuable and will speed your recovery. Thank you for using e-visits.  Greater than 5 minutes, yet less than 10 minutes of time have been spent researching, coordinating and implementing care for this patient today.

## 2020-09-27 ENCOUNTER — Other Ambulatory Visit: Payer: No Typology Code available for payment source

## 2020-09-27 ENCOUNTER — Other Ambulatory Visit: Payer: Self-pay

## 2020-09-27 DIAGNOSIS — Z20822 Contact with and (suspected) exposure to covid-19: Secondary | ICD-10-CM

## 2020-09-30 LAB — NOVEL CORONAVIRUS, NAA: SARS-CoV-2, NAA: NOT DETECTED

## 2020-10-16 ENCOUNTER — Other Ambulatory Visit: Payer: Self-pay | Admitting: Nurse Practitioner

## 2020-10-16 ENCOUNTER — Other Ambulatory Visit: Payer: Self-pay | Admitting: Internal Medicine

## 2020-10-16 DIAGNOSIS — G43009 Migraine without aura, not intractable, without status migrainosus: Secondary | ICD-10-CM

## 2020-10-16 DIAGNOSIS — K219 Gastro-esophageal reflux disease without esophagitis: Secondary | ICD-10-CM

## 2020-10-21 ENCOUNTER — Telehealth: Payer: Self-pay

## 2020-10-21 NOTE — Telephone Encounter (Signed)
Patient's PA for Roselyn Meier has been approved through 10/20/21 with a maximum of 12 refills for a quantity of 16 per 30 days. YL,RMA

## 2020-10-28 ENCOUNTER — Other Ambulatory Visit: Payer: Self-pay | Admitting: Nurse Practitioner

## 2020-10-28 ENCOUNTER — Encounter: Payer: Self-pay | Admitting: Nurse Practitioner

## 2020-10-28 ENCOUNTER — Other Ambulatory Visit: Payer: Self-pay

## 2020-10-28 DIAGNOSIS — K219 Gastro-esophageal reflux disease without esophagitis: Secondary | ICD-10-CM

## 2020-10-28 MED ORDER — PANTOPRAZOLE SODIUM 20 MG PO TBEC: 20.0000 mg | DELAYED_RELEASE_TABLET | Freq: Every day | ORAL | 1 refills | Status: DC

## 2020-11-25 ENCOUNTER — Other Ambulatory Visit: Payer: Self-pay

## 2020-11-27 ENCOUNTER — Other Ambulatory Visit: Payer: Self-pay | Admitting: Obstetrics and Gynecology

## 2020-11-27 MED ORDER — SERTRALINE HCL 50 MG PO TABS: 50.0000 mg | ORAL_TABLET | Freq: Every day | ORAL | 3 refills | Status: DC

## 2020-12-01 ENCOUNTER — Other Ambulatory Visit: Payer: Self-pay | Admitting: Otolaryngology

## 2020-12-04 ENCOUNTER — Other Ambulatory Visit: Payer: Self-pay | Admitting: Physician Assistant

## 2020-12-05 ENCOUNTER — Encounter: Payer: Self-pay | Admitting: Nurse Practitioner

## 2020-12-09 ENCOUNTER — Other Ambulatory Visit: Payer: Self-pay | Admitting: Obstetrics and Gynecology

## 2020-12-09 DIAGNOSIS — M5430 Sciatica, unspecified side: Secondary | ICD-10-CM

## 2020-12-09 NOTE — Progress Notes (Signed)
Patient requests referral back to chiropractor  She is currently seeing Ortho, who has prescribed Prednisone. Has seen [facility] in the past.  Desires to re-establish care.  Order placed.   [name], MD

## 2020-12-24 ENCOUNTER — Other Ambulatory Visit: Payer: Self-pay

## 2020-12-24 MED ORDER — SERTRALINE HCL 50 MG PO TABS
ORAL_TABLET | ORAL | 3 refills | Status: DC
Start: 2020-12-24 — End: 2022-01-05
  Filled 2020-12-24: qty 90, 90d supply, fill #0
  Filled 2021-03-31: qty 90, 90d supply, fill #1
  Filled 2021-07-01: qty 90, 90d supply, fill #2
  Filled 2021-10-07: qty 90, 90d supply, fill #3

## 2021-01-05 ENCOUNTER — Other Ambulatory Visit: Payer: Self-pay

## 2021-01-05 MED FILL — Montelukast Sodium Tab 10 MG (Base Equiv): ORAL | 30 days supply | Qty: 30 | Fill #0 | Status: AC

## 2021-01-23 ENCOUNTER — Encounter: Payer: Self-pay | Admitting: Obstetrics and Gynecology

## 2021-03-09 ENCOUNTER — Ambulatory Visit (INDEPENDENT_AMBULATORY_CARE_PROVIDER_SITE_OTHER): Payer: No Typology Code available for payment source | Admitting: Nurse Practitioner

## 2021-03-09 ENCOUNTER — Other Ambulatory Visit: Payer: Self-pay

## 2021-03-09 ENCOUNTER — Encounter: Payer: Self-pay | Admitting: Nurse Practitioner

## 2021-03-09 VITALS — BP 114/80 | HR 65 | Temp 98.6°F | Ht <= 58 in | Wt 150.4 lb

## 2021-03-09 DIAGNOSIS — E7800 Pure hypercholesterolemia, unspecified: Secondary | ICD-10-CM

## 2021-03-09 DIAGNOSIS — E6609 Other obesity due to excess calories: Secondary | ICD-10-CM

## 2021-03-09 DIAGNOSIS — Z0000 Encounter for general adult medical examination without abnormal findings: Secondary | ICD-10-CM

## 2021-03-09 DIAGNOSIS — K5909 Other constipation: Secondary | ICD-10-CM

## 2021-03-09 DIAGNOSIS — F325 Major depressive disorder, single episode, in full remission: Secondary | ICD-10-CM

## 2021-03-09 DIAGNOSIS — Z6832 Body mass index (BMI) 32.0-32.9, adult: Secondary | ICD-10-CM

## 2021-03-09 DIAGNOSIS — E559 Vitamin D deficiency, unspecified: Secondary | ICD-10-CM

## 2021-03-09 MED ORDER — TRULANCE 3 MG PO TABS
1.0000 | ORAL_TABLET | Freq: Every day | ORAL | 1 refills | Status: DC
Start: 2021-03-09 — End: 2022-04-29
  Filled 2021-03-09 – 2021-03-19 (×3): qty 90, 90d supply, fill #0

## 2021-03-09 NOTE — Patient Instructions (Addendum)
Health Maintenance, Female Adopting a healthy lifestyle and getting preventive care are important in promoting health and wellness. Ask your health care provider about: The right schedule for you to have regular tests and exams. Things you can do on your own to prevent diseases and keep yourself healthy. What should I know about diet, weight, and exercise? Eat a healthy diet  Eat a diet that includes plenty of vegetables, fruits, low-fat dairy products, and lean protein. Do not eat a lot of foods that are high in solid fats, added sugars, or sodium.  Maintain a healthy weight Body mass index (BMI) is used to identify weight problems. It estimates body fat based on height and weight. Your health care provider can help determineyour BMI and help you achieve or maintain a healthy weight. Get regular exercise Get regular exercise. This is one of the most important things you can do for your health. Most adults should: Exercise for at least 150 minutes each week. The exercise should increase your heart rate and make you sweat (moderate-intensity exercise). Do strengthening exercises at least twice a week. This is in addition to the moderate-intensity exercise. Spend less time sitting. Even light physical activity can be beneficial. Watch cholesterol and blood lipids Have your blood tested for lipids and cholesterol at 34 years of age, then havethis test every 5 years. Have your cholesterol levels checked more often if: Your lipid or cholesterol levels are high. You are older than 34 years of age. You are at high risk for heart disease. What should I know about cancer screening? Depending on your health history and family history, you may need to have cancer screening at various ages. This may include screening for: Breast cancer. Cervical cancer. Colorectal cancer. Skin cancer. Lung cancer. What should I know about heart disease, diabetes, and high blood pressure? Blood pressure and heart  disease High blood pressure causes heart disease and increases the risk of stroke. This is more likely to develop in people who have high blood pressure readings, are of African descent, or are overweight. Have your blood pressure checked: Every 3-5 years if you are 18-39 years of age. Every year if you are 40 years old or older. Diabetes Have regular diabetes screenings. This checks your fasting blood sugar level. Have the screening done: Once every three years after age 40 if you are at a normal weight and have a low risk for diabetes. More often and at a younger age if you are overweight or have a high risk for diabetes. What should I know about preventing infection? Hepatitis B If you have a higher risk for hepatitis B, you should be screened for this virus. Talk with your health care provider to find out if you are at risk forhepatitis B infection. Hepatitis C Testing is recommended for: Everyone born from 1945 through 1965. Anyone with known risk factors for hepatitis C. Sexually transmitted infections (STIs) Get screened for STIs, including gonorrhea and chlamydia, if: You are sexually active and are younger than 34 years of age. You are older than 34 years of age and your health care provider tells you that you are at risk for this type of infection. Your sexual activity has changed since you were last screened, and you are at increased risk for chlamydia or gonorrhea. Ask your health care provider if you are at risk. Ask your health care provider about whether you are at high risk for HIV. Your health care provider may recommend a prescription medicine to help   prevent HIV infection. If you choose to take medicine to prevent HIV, you should first get tested for HIV. You should then be tested every 3 months for as long as you are taking the medicine. Pregnancy If you are about to stop having your period (premenopausal) and you may become pregnant, seek counseling before you get  pregnant. Take 400 to 800 micrograms (mcg) of folic acid every day if you become pregnant. Ask for birth control (contraception) if you want to prevent pregnancy. Osteoporosis and menopause Osteoporosis is a disease in which the bones lose minerals and strength with aging. This can result in bone fractures. If you are 65 years old or older, or if you are at risk for osteoporosis and fractures, ask your health care provider if you should: Be screened for bone loss. Take a calcium or vitamin D supplement to lower your risk of fractures. Be given hormone replacement therapy (HRT) to treat symptoms of menopause. Follow these instructions at home: Lifestyle Do not use any products that contain nicotine or tobacco, such as cigarettes, e-cigarettes, and chewing tobacco. If you need help quitting, ask your health care provider. Do not use street drugs. Do not share needles. Ask your health care provider for help if you need support or information about quitting drugs. Alcohol use Do not drink alcohol if: Your health care provider tells you not to drink. You are pregnant, may be pregnant, or are planning to become pregnant. If you drink alcohol: Limit how much you use to 0-1 drink a day. Limit intake if you are breastfeeding. Be aware of how much alcohol is in your drink. In the [place] one drink equals one 12 oz bottle of beer (355 mL), one 5 oz glass of wine (148 mL), or one 1 oz glass of hard liquor (44 mL). General instructions Schedule regular health, dental, and eye exams. Stay current with your vaccines. Tell your health care provider if: You often feel depressed. You have ever been abused or do not feel safe at home. Summary Adopting a healthy lifestyle and getting preventive care are important in promoting health and wellness. Follow your health care provider's instructions about healthy diet, exercising, and getting tested or screened for diseases. Follow your health care provider's  instructions on monitoring your cholesterol and blood pressure. This information is not intended to replace advice given to you by your health care provider. Make sure you discuss any questions you have with your healthcare provider. Document Revised: 08/23/2018 Document Reviewed: 08/23/2018 Elsevier Patient Education  2022 Elsevier Inc.  

## 2021-03-09 NOTE — Progress Notes (Signed)
I,Carolyn Barnes as a Education administrator for [employer], FNP.,have documented all relevant documentation on the behalf of Carolyn Brine, FNP,as directed by  Carolyn Brine, FNP while in the presence of Carolyn Barnes, [place].   This visit occurred during the SARS-CoV-2 public health emergency.  Safety protocols were in place, including screening questions prior to the visit, additional usage of staff PPE, and extensive cleaning of exam room while observing appropriate contact time as indicated for disinfecting solutions.  Subjective:     Patient ID: Carolyn Barnes , female    DOB: September 20, 1985 , 35 y.o.   MRN: [id number]   Chief Complaint  Patient presents with   Annual Exam    HPI  Patient here for hm.  She has been to [facility] - for her right wrist pain, had xrays and PT with some relief. She does not have a follow up scheduled.   She is taking Zoloft daily given by her GYN - she started this medication in January  She has tried [place], linzess, miralax and colace without effect  Wt Readings from Last 3 Encounters: 03/09/21 : 150 lb 6.4 oz (68.2 kg) 07/25/20 : 143 lb 3.2 oz (65 kg) 06/03/20 : 142 lb 9.6 oz (64.7 kg)      Past Medical History:  Diagnosis Date   Anxiety    Fatigue    GERD (gastroesophageal reflux disease)    History of hiatal hernia    Indigestion    Migraine headache    Seasonal allergies    Vitamin D deficiency disease      Family History  Problem Relation Age of Onset   Heart disease Maternal Grandmother    Diabetes Maternal Grandmother    Healthy Mother    Healthy Daughter    Breast cancer Neg Hx    Ovarian cancer Neg Hx    Colon cancer Neg Hx      Current Outpatient Medications:    fluticasone (FLONASE) 50 MCG/ACT nasal spray, Place 2 sprays into both nostrils daily., Disp: 16 g, Rfl: 0   montelukast (SINGULAIR) 10 MG tablet, TAKE 1 TABLET BY MOUTH DAILY EVERY EVENING, Disp: 30 tablet, Rfl: 12   pantoprazole (PROTONIX) 20 MG tablet, TAKE  1 TABLET (20 MG TOTAL) BY MOUTH DAILY., Disp: 90 tablet, Rfl: 1   sertraline (ZOLOFT) 50 MG tablet, TAKE 1 TABLET (50 MG TOTAL) BY MOUTH DAILY. DAY 21 OF CYCLE, Disp: 90 tablet, Rfl: 3   Ubrogepant 50 MG TABS, TAKE 1 TABLET BY MOUTH DAILY., Disp: 10 tablet, Rfl: 1   Plecanatide (TRULANCE) 3 MG TABS, Take 1 tablet by mouth daily at 2 PM., Disp: 90 tablet, Rfl: 1   No Known Allergies    The patient states she uses vasectomy for birth control. Last LMP was Patient's last menstrual period was 03/04/2021.. Negative for Dysmenorrhea and Negative for Menorrhagia. Negative for: breast discharge, breast lump(s), breast pain and breast self exam. Associated symptoms include abnormal vaginal bleeding. Pertinent negatives include abnormal bleeding (hematology), anxiety, decreased libido, depression, difficulty falling sleep, dyspareunia, history of infertility, nocturia, sexual dysfunction, sleep disturbances, urinary incontinence, urinary urgency, vaginal discharge and vaginal itching. Diet regular. The patient states her exercise level is none.    The patient's tobacco use is:  Social History   Tobacco Use  Smoking Status Never  Smokeless Tobacco Never   She has been exposed to passive smoke. The patient's alcohol use is:  Social History   Substance and Sexual Activity  Alcohol Use No  Additional information: Last pap 07/25/20 with Dr. Marcelline Mates, next one due in 3 years.  Review of Systems  Constitutional: Negative.   HENT: Negative.    Eyes: Negative.   Respiratory: Negative.    Cardiovascular: Negative.   Gastrointestinal: Negative.   Endocrine: Negative.   Genitourinary: Negative.   Musculoskeletal: Negative.   Skin: Negative.   Allergic/Immunologic: Negative.   Neurological: Negative.  Negative for dizziness.  Hematological: Negative.   Psychiatric/Behavioral: Negative.      Today's Vitals   03/09/21 0854  BP: 114/80  Pulse: 65  Temp: 98.6 F (37 C)  Weight: 150 lb 6.4 oz (68.2  kg)  Height: 4' 9" (1.448 m)  PainSc: 0-No pain   Body mass index is 32.55 kg/m.   Objective:  Physical Exam Vitals reviewed.  Constitutional:      General: She is not in acute distress.    Appearance: Normal appearance. She is well-developed. She is obese.  HENT:     Head: Normocephalic and atraumatic.     Right Ear: Hearing, tympanic membrane, ear canal and external ear normal. There is no impacted cerumen.     Left Ear: Hearing, tympanic membrane, ear canal and external ear normal. There is no impacted cerumen.     Nose:     Comments: Deferred - masked     Mouth/Throat:     Comments: Deferred - masked Eyes:     General: Lids are normal.     Conjunctiva/sclera: Conjunctivae normal.     Pupils: Pupils are equal, round, and reactive to light.     Funduscopic exam:    Right eye: No papilledema.        Left eye: No papilledema.  Neck:     Thyroid: No thyroid mass.     Vascular: No carotid bruit.  Cardiovascular:     Rate and Rhythm: Normal rate and regular rhythm.     Pulses: Normal pulses.     Heart sounds: Normal heart sounds. No murmur heard. Pulmonary:     Effort: Pulmonary effort is normal. No respiratory distress.     Breath sounds: Normal breath sounds.  Abdominal:     General: Abdomen is flat. Bowel sounds are normal.     Palpations: Abdomen is soft.  Musculoskeletal:        General: No swelling. Normal range of motion.     Cervical back: Full passive range of motion without pain, normal range of motion and neck supple.     Right lower leg: No edema.     Left lower leg: No edema.  Skin:    General: Skin is warm and dry.     Capillary Refill: Capillary refill takes less than 2 seconds.  Neurological:     General: No focal deficit present.     Mental Status: She is alert and oriented to person, place, and time.     Cranial Nerves: No cranial nerve deficit.     Sensory: No sensory deficit.  Psychiatric:        Mood and Affect: Mood normal.        Behavior:  Behavior normal.        Thought Content: Thought content normal.        Judgment: Judgment normal.        Assessment And Plan:     1. Encounter for general adult medical examination w/o abnormal findings Behavior modifications discussed and diet history reviewed.   Pt will continue to exercise regularly and modify diet with low  GI, plant based foods and decrease intake of processed foods.  Recommend intake of daily multivitamin, Vitamin D, and calcium.  Recommend for preventive screenings, as well as recommend immunizations that include influenza, TDAP. She is to see if her job requires her 2nd covid booster.  encouraged to perform self breast exams monthly the week after her menstrual cycle. If any concerns go to GYN or call to office for evaluation - CBC  2. Class 1 obesity due to excess calories with serious comorbidity and body mass index (BMI) of 32.0 to 32.9 in adult Chronic Discussed healthy diet and regular exercise options  Goal to exercise 150 minutes per week with at least 2 days of strength training Encouraged to park further when at the store, take stairs instead of elevators and to walk in place during commercials. Increase water intake to at least one gallon of water daily.   3. Vitamin D deficiency Will check vitamin D level and supplement as needed.    Also encouraged to spend 15 minutes in the sun daily.  - CMP14+EGFR - VITAMIN D 25 Hydroxy (Vit-D Deficiency, Fractures)  4. Elevated cholesterol Chronic, controlled No current medications - Lipid panel  5. Major depressive disorder with single episode, in full remission (HCC) Chronic, depression score of 2 - she is currently taking Zoloft given by her GYN  6. Chronic constipation She has tried amitiza, linzess, miralax, and colace in the past She has been doing well with Trulance - Plecanatide (TRULANCE) 3 MG TABS; Take 1 tablet by mouth daily at 2 PM.  Dispense: 90 tablet; Refill: 1     Patient was given  opportunity to ask questions. Patient verbalized understanding of the plan and was able to repeat key elements of the plan. All questions were answered to their satisfaction.    , FNP   I,  , FNP, have reviewed all documentation for this visit. The documentation on 03/09/21 for the exam, diagnosis, procedures, and orders are all accurate and complete.   THE PATIENT IS ENCOURAGED TO PRACTICE SOCIAL DISTANCING DUE TO THE COVID-19 PANDEMIC.    

## 2021-03-10 LAB — CBC
Hematocrit: 36.8 % (ref 34.0–46.6)
Hemoglobin: 12.6 g/dL (ref 11.1–15.9)
MCH: 32.3 pg (ref 26.6–33.0)
MCHC: 34.2 g/dL (ref 31.5–35.7)
MCV: 94 fL (ref 79–97)
Platelets: 312 10*3/uL (ref 150–450)
RBC: 3.9 x10E6/uL (ref 3.77–5.28)
RDW: 11.1 % — ABNORMAL LOW (ref 11.7–15.4)
WBC: 4.1 10*3/uL (ref 3.4–10.8)

## 2021-03-10 LAB — CMP14+EGFR
ALT: 13 IU/L (ref 0–32)
AST: 17 IU/L (ref 0–40)
Albumin/Globulin Ratio: 1.7 (ref 1.2–2.2)
Albumin: 4.3 g/dL (ref 3.8–4.8)
Alkaline Phosphatase: 50 IU/L (ref 44–121)
BUN/Creatinine Ratio: 9 (ref 9–23)
BUN: 8 mg/dL (ref 6–20)
Bilirubin Total: 0.2 mg/dL (ref 0.0–1.2)
CO2: 21 mmol/L (ref 20–29)
Calcium: 9.1 mg/dL (ref 8.7–10.2)
Chloride: 106 mmol/L (ref 96–106)
Creatinine, Ser: 0.85 mg/dL (ref 0.57–1.00)
Globulin, Total: 2.6 g/dL (ref 1.5–4.5)
Glucose: 79 mg/dL (ref 65–99)
Potassium: 4.1 mmol/L (ref 3.5–5.2)
Sodium: 139 mmol/L (ref 134–144)
Total Protein: 6.9 g/dL (ref 6.0–8.5)
eGFR: 92 mL/min/{1.73_m2} (ref >59)

## 2021-03-10 LAB — LIPID PANEL
Chol/HDL Ratio: 2.7 ratio (ref 0.0–4.4)
Cholesterol, Total: 183 mg/dL (ref 100–199)
HDL: 69 mg/dL (ref >39)
LDL Chol Calc (NIH): 104 mg/dL — ABNORMAL HIGH (ref 0–99)
Triglycerides: 54 mg/dL (ref 0–149)
VLDL Cholesterol Cal: 10 mg/dL (ref 5–40)

## 2021-03-10 LAB — VITAMIN D 25 HYDROXY (VIT D DEFICIENCY, FRACTURES): Vit D, 25-Hydroxy: 33.4 ng/mL (ref 30.0–100.0)

## 2021-03-11 ENCOUNTER — Other Ambulatory Visit: Payer: Self-pay

## 2021-03-16 MED FILL — Montelukast Sodium Tab 10 MG (Base Equiv): ORAL | 30 days supply | Qty: 30 | Fill #1 | Status: AC

## 2021-03-17 ENCOUNTER — Other Ambulatory Visit: Payer: Self-pay

## 2021-03-19 ENCOUNTER — Other Ambulatory Visit: Payer: Self-pay

## 2021-03-20 ENCOUNTER — Other Ambulatory Visit: Payer: Self-pay

## 2021-04-01 ENCOUNTER — Other Ambulatory Visit: Payer: Self-pay

## 2021-05-08 ENCOUNTER — Encounter: Payer: Self-pay | Admitting: Obstetrics and Gynecology

## 2021-05-08 ENCOUNTER — Other Ambulatory Visit: Payer: Self-pay | Admitting: Obstetrics and Gynecology

## 2021-05-08 ENCOUNTER — Other Ambulatory Visit: Payer: Self-pay

## 2021-05-08 MED FILL — Fluconazole Tab 150 MG: ORAL | 1 days supply | Qty: 1 | Fill #0 | Status: AC

## 2021-07-01 ENCOUNTER — Other Ambulatory Visit: Payer: Self-pay

## 2021-07-30 ENCOUNTER — Encounter: Payer: No Typology Code available for payment source | Admitting: Obstetrics and Gynecology

## 2021-07-31 ENCOUNTER — Encounter: Payer: No Typology Code available for payment source | Admitting: Obstetrics and Gynecology

## 2021-10-07 ENCOUNTER — Other Ambulatory Visit: Payer: Self-pay

## 2021-10-08 ENCOUNTER — Other Ambulatory Visit: Payer: Self-pay

## 2021-10-08 MED FILL — Montelukast Sodium Tab 10 MG (Base Equiv): ORAL | 30 days supply | Qty: 30 | Fill #2 | Status: AC

## 2021-10-21 ENCOUNTER — Telehealth: Payer: Self-pay

## 2021-10-21 NOTE — Telephone Encounter (Signed)
Prior auth done for Carolyn Barnes, waiting on a response from the pt's insurance.

## 2021-10-28 ENCOUNTER — Telehealth: Payer: Self-pay

## 2021-10-28 ENCOUNTER — Other Ambulatory Visit: Payer: Self-pay

## 2021-10-28 ENCOUNTER — Other Ambulatory Visit: Payer: Self-pay | Admitting: Nurse Practitioner

## 2021-10-28 DIAGNOSIS — G43009 Migraine without aura, not intractable, without status migrainosus: Secondary | ICD-10-CM

## 2021-10-28 NOTE — Telephone Encounter (Signed)
Left pt vm. Roselyn Meier approved. Confirmation faxed.

## 2021-10-29 ENCOUNTER — Other Ambulatory Visit: Payer: Self-pay

## 2021-10-29 NOTE — Telephone Encounter (Signed)
Did she have a PA?

## 2021-10-30 ENCOUNTER — Other Ambulatory Visit: Payer: Self-pay

## 2021-11-02 ENCOUNTER — Other Ambulatory Visit: Payer: Self-pay

## 2021-11-03 ENCOUNTER — Other Ambulatory Visit: Payer: Self-pay

## 2021-11-04 ENCOUNTER — Other Ambulatory Visit: Payer: Self-pay | Admitting: Nurse Practitioner

## 2021-11-04 ENCOUNTER — Encounter: Payer: Self-pay | Admitting: Nurse Practitioner

## 2021-11-04 ENCOUNTER — Other Ambulatory Visit: Payer: Self-pay

## 2021-11-04 DIAGNOSIS — G43009 Migraine without aura, not intractable, without status migrainosus: Secondary | ICD-10-CM

## 2021-11-04 DIAGNOSIS — K219 Gastro-esophageal reflux disease without esophagitis: Secondary | ICD-10-CM

## 2021-11-04 MED ORDER — PANTOPRAZOLE SODIUM 20 MG PO TBEC
DELAYED_RELEASE_TABLET | Freq: Every day | ORAL | 1 refills | Status: DC
  Filled 2021-11-04: qty 90, 90d supply, fill #0

## 2021-11-04 MED ORDER — UBROGEPANT 50 MG PO TABS
1.0000 | ORAL_TABLET | Freq: Every day | ORAL | 1 refills | Status: DC
  Filled 2021-11-04: qty 10, 30d supply, fill #0

## 2021-11-05 ENCOUNTER — Other Ambulatory Visit: Payer: Self-pay

## 2021-11-11 ENCOUNTER — Other Ambulatory Visit: Payer: Self-pay

## 2021-11-11 MED FILL — Ubrogepant Tab 50 MG: ORAL | 10 days supply | Qty: 10 | Fill #0 | Status: CN

## 2021-12-22 ENCOUNTER — Encounter: Payer: Self-pay | Admitting: Nurse Practitioner

## 2021-12-22 ENCOUNTER — Other Ambulatory Visit: Payer: Self-pay

## 2021-12-22 ENCOUNTER — Ambulatory Visit (INDEPENDENT_AMBULATORY_CARE_PROVIDER_SITE_OTHER): Payer: No Typology Code available for payment source | Admitting: Nurse Practitioner

## 2021-12-22 VITALS — BP 116/80 | HR 84 | Temp 99.5°F | Ht <= 58 in | Wt 154.2 lb

## 2021-12-22 DIAGNOSIS — Z6833 Body mass index (BMI) 33.0-33.9, adult: Secondary | ICD-10-CM | POA: Diagnosis not present

## 2021-12-22 DIAGNOSIS — E6609 Other obesity due to excess calories: Secondary | ICD-10-CM | POA: Diagnosis not present

## 2021-12-22 DIAGNOSIS — F325 Major depressive disorder, single episode, in full remission: Secondary | ICD-10-CM | POA: Diagnosis not present

## 2021-12-22 MED ORDER — WEGOVY 0.5 MG/0.5ML ~~LOC~~ SOAJ
0.5000 mg | SUBCUTANEOUS | 1 refills | Status: DC
  Filled 2021-12-22 – 2022-01-06 (×3): qty 2, 28d supply, fill #0

## 2021-12-22 NOTE — Progress Notes (Signed)
?[employer] as a Education administrator for [employer], FNP.,have documented all relevant documentation on the behalf of [name], FNP,as directed by  [name], FNP while in the presence of [name], [place].  ? ?This visit occurred during the SARS-CoV-2 public health emergency.  Safety protocols were in place, including screening questions prior to the visit, additional usage of staff PPE, and extensive cleaning of exam room while observing appropriate contact time as indicated for disinfecting solutions. ? ?Subjective:  ?  ? Patient ID: Carolyn Barnes , female    DOB: [birth year] , 36 y.o.   MRN: [id number] ? ? ?Chief Complaint  ?Patient presents with  ? Obesity  ? ? ?HPI ? ?Patient presents today for a weight check. Her weight is back up again and struggling to get restarted. She was going 4 days a week in the fall she is now going 2 days a week for 30 - 40 minutes. She is eating out more due to the kids activities. She had not been choosing better options. She has decreased her water intake now drinking one bottle a day. She is drinking sweet tea. She has had phentermine in the past caused heart palpitations. She is looking to restart medications. Has taken [facility] in past. She reports her weight in October was down to 142 lbs.  ? ?Wt Readings from Last 3 Encounters: ?12/22/21 : 154 lb 3.2 oz (69.9 kg) ?03/09/21 : 150 lb 6.4 oz (68.2 kg) ?07/25/20 : 143 lb 3.2 oz (65 kg) ?  ?  ? ?Past Medical History:  ?Diagnosis Date  ? Anxiety   ? Fatigue   ? GERD (gastroesophageal reflux disease)   ? History of hiatal hernia   ? Indigestion   ? Migraine headache   ? Seasonal allergies   ? Vitamin D deficiency disease   ?  ? ?Family History  ?Problem Relation Age of Onset  ? Heart disease Maternal Grandmother   ? Diabetes Maternal Grandmother   ? Healthy Mother   ? Healthy Daughter   ? Breast cancer Neg Hx   ? Ovarian cancer Neg Hx   ? Colon cancer Neg Hx   ? ? ? ?Current Outpatient Medications:  ?  fluticasone  (FLONASE) 50 MCG/ACT nasal spray, Place 2 sprays into both nostrils daily., Disp: 16 g, Rfl: 0 ?  montelukast (SINGULAIR) 10 MG tablet, TAKE 1 TABLET BY MOUTH DAILY EVERY EVENING, Disp: 30 tablet, Rfl: 12 ?  Plecanatide (TRULANCE) 3 MG TABS, Take 1 tablet by mouth daily at 2 PM., Disp: 90 tablet, Rfl: 1 ?  Semaglutide-Weight Management (WEGOVY) 0.5 MG/0.5ML SOAJ, Inject 0.5 mg into the skin once a week., Disp: 2 mL, Rfl: 1 ?  Ubrogepant (UBRELVY) 50 MG TABS, TAKE 1 TABLET BY MOUTH DAILY., Disp: 10 tablet, Rfl: 1 ?  sertraline (ZOLOFT) 50 MG tablet, TAKE 1 TABLET (50 MG TOTAL) BY MOUTH DAILY. DAY 21 OF CYCLE, Disp: 90 tablet, Rfl: 3  ? ?No Known Allergies  ? ?Review of Systems  ?Constitutional: Negative.   ?Respiratory: Negative.    ?Cardiovascular: Negative.  Negative for chest pain, palpitations and leg swelling.  ?Psychiatric/Behavioral: Negative.     ? ?Today's Vitals  ? 12/22/21 1637  ?BP: 116/80  ?Pulse: 84  ?Temp: 99.5 ?F (37.5 ?C)  ?Weight: 154 lb 3.2 oz (69.9 kg)  ?Height: '4\' 9"'$  (1.448 m)  ?PainSc: 0-No pain  ? ?Body mass index is 33.37 kg/m?.  ? ?Objective:  ?Physical Exam ?Vitals reviewed.  ?Constitutional:   ?  General: She is not in acute distress. ?   Appearance: Normal appearance.  ?Cardiovascular:  ?   Rate and Rhythm: Normal rate and regular rhythm.  ?   Pulses: Normal pulses.  ?   Heart sounds: Normal heart sounds. No murmur heard. ?Pulmonary:  ?   Effort: Pulmonary effort is normal. No respiratory distress.  ?   Breath sounds: Normal breath sounds.  ?Neurological:  ?   General: No focal deficit present.  ?   Mental Status: She is alert and oriented to person, place, and time.  ?   Cranial Nerves: No cranial nerve deficit.  ?Psychiatric:     ?   Mood and Affect: Mood normal.     ?   Behavior: Behavior normal.     ?   Thought Content: Thought content normal.     ?   Judgment: Judgment normal.  ?  ? ?   ?Assessment And Plan:  ?   ?1. Class 1 obesity due to excess calories with body mass index (BMI)  of 33.0 to 33.9 in adult, unspecified whether serious comorbidity present ?Comments: Will get her started on wegovy, encouraged to increase physical activity and exercise regularly at least 150 minutes a week ?- Semaglutide-Weight Management (WEGOVY) 0.5 MG/0.5ML SOAJ; Inject 0.5 mg into the skin once a week.  Dispense: 2 mL; Refill: 1 ? ?2. Major depressive disorder with single episode, in full remission ([place]) ? Stable, no changes. No current medications depression screen is 3 ? ?Patient was given opportunity to ask questions. Patient verbalized understanding of the plan and was able to repeat key elements of the plan. All questions were answered to their satisfaction.  ?[name], FNP  ? ?I, [name], FNP, have reviewed all documentation for this visit. The documentation on 12/22/21 for the exam, diagnosis, procedures, and orders are all accurate and complete.  ? ?IF YOU HAVE BEEN REFERRED TO A SPECIALIST, IT MAY TAKE 1-2 WEEKS TO SCHEDULE/PROCESS THE REFERRAL. IF YOU HAVE NOT HEARD FROM US/SPECIALIST IN TWO WEEKS, PLEASE GIVE [place] A CALL AT [phone].  ? ?THE PATIENT IS ENCOURAGED TO PRACTICE SOCIAL DISTANCING DUE TO THE COVID-19 PANDEMIC.   ?

## 2021-12-22 NOTE — Patient Instructions (Signed)

## 2021-12-23 ENCOUNTER — Other Ambulatory Visit: Payer: Self-pay

## 2021-12-30 ENCOUNTER — Other Ambulatory Visit: Payer: Self-pay

## 2021-12-31 ENCOUNTER — Other Ambulatory Visit: Payer: Self-pay

## 2022-01-05 ENCOUNTER — Other Ambulatory Visit: Payer: Self-pay

## 2022-01-05 ENCOUNTER — Other Ambulatory Visit: Payer: Self-pay | Admitting: Obstetrics and Gynecology

## 2022-01-05 MED ORDER — SERTRALINE HCL 50 MG PO TABS
ORAL_TABLET | ORAL | 3 refills | Status: DC
  Filled 2022-01-05: qty 90, 90d supply, fill #0
  Filled 2022-04-18: qty 90, 90d supply, fill #1

## 2022-01-06 ENCOUNTER — Other Ambulatory Visit: Payer: Self-pay

## 2022-01-12 ENCOUNTER — Encounter: Payer: Self-pay | Admitting: Nurse Practitioner

## 2022-03-02 ENCOUNTER — Ambulatory Visit: Payer: No Typology Code available for payment source | Admitting: Nurse Practitioner

## 2022-03-11 ENCOUNTER — Encounter: Payer: Self-pay | Admitting: Nurse Practitioner

## 2022-03-11 ENCOUNTER — Ambulatory Visit (INDEPENDENT_AMBULATORY_CARE_PROVIDER_SITE_OTHER): Payer: No Typology Code available for payment source | Admitting: Nurse Practitioner

## 2022-03-11 VITALS — BP 120/64 | HR 77 | Temp 98.9°F | Ht <= 58 in | Wt 159.0 lb

## 2022-03-11 DIAGNOSIS — E559 Vitamin D deficiency, unspecified: Secondary | ICD-10-CM

## 2022-03-11 DIAGNOSIS — Z6834 Body mass index (BMI) 34.0-34.9, adult: Secondary | ICD-10-CM

## 2022-03-11 DIAGNOSIS — Z0000 Encounter for general adult medical examination without abnormal findings: Secondary | ICD-10-CM

## 2022-03-11 DIAGNOSIS — F325 Major depressive disorder, single episode, in full remission: Secondary | ICD-10-CM

## 2022-03-11 DIAGNOSIS — E7800 Pure hypercholesterolemia, unspecified: Secondary | ICD-10-CM | POA: Diagnosis not present

## 2022-03-11 DIAGNOSIS — Z79899 Other long term (current) drug therapy: Secondary | ICD-10-CM

## 2022-03-11 DIAGNOSIS — E6609 Other obesity due to excess calories: Secondary | ICD-10-CM

## 2022-03-11 DIAGNOSIS — R220 Localized swelling, mass and lump, head: Secondary | ICD-10-CM | POA: Diagnosis not present

## 2022-03-11 NOTE — Patient Instructions (Addendum)
Health Maintenance, Female Adopting a healthy lifestyle and getting preventive care are important in promoting health and wellness. Ask your health care provider about: The right schedule for you to have regular tests and exams. Things you can do on your own to prevent diseases and keep yourself healthy. What should I know about diet, weight, and exercise? Eat a healthy diet  Eat a diet that includes plenty of vegetables, fruits, low-fat dairy products, and lean protein. Do not eat a lot of foods that are high in solid fats, added sugars, or sodium. Maintain a healthy weight Body mass index (BMI) is used to identify weight problems. It estimates body fat based on height and weight. Your health care provider can help determine your BMI and help you achieve or maintain a healthy weight. Get regular exercise Get regular exercise. This is one of the most important things you can do for your health. Most adults should: Exercise for at least 150 minutes each week. The exercise should increase your heart rate and make you sweat (moderate-intensity exercise). Do strengthening exercises at least twice a week. This is in addition to the moderate-intensity exercise. Spend less time sitting. Even light physical activity can be beneficial. Watch cholesterol and blood lipids Have your blood tested for lipids and cholesterol at 35 years of age, then have this test every 5 years. Have your cholesterol levels checked more often if: Your lipid or cholesterol levels are high. You are older than 35 years of age. You are at high risk for heart disease. What should I know about cancer screening? Depending on your health history and family history, you may need to have cancer screening at various ages. This may include screening for: Breast cancer. Cervical cancer. Colorectal cancer. Skin cancer. Lung cancer. What should I know about heart disease, diabetes, and high blood pressure? Blood pressure and heart  disease High blood pressure causes heart disease and increases the risk of stroke. This is more likely to develop in people who have high blood pressure readings or are overweight. Have your blood pressure checked: Every 3-5 years if you are 18-39 years of age. Every year if you are 40 years old or older. Diabetes Have regular diabetes screenings. This checks your fasting blood sugar level. Have the screening done: Once every three years after age 40 if you are at a normal weight and have a low risk for diabetes. More often and at a younger age if you are overweight or have a high risk for diabetes. What should I know about preventing infection? Hepatitis B If you have a higher risk for hepatitis B, you should be screened for this virus. Talk with your health care provider to find out if you are at risk for hepatitis B infection. Hepatitis C Testing is recommended for: Everyone born from 1945 through 1965. Anyone with known risk factors for hepatitis C. Sexually transmitted infections (STIs) Get screened for STIs, including gonorrhea and chlamydia, if: You are sexually active and are younger than 35 years of age. You are older than 35 years of age and your health care provider tells you that you are at risk for this type of infection. Your sexual activity has changed since you were last screened, and you are at increased risk for chlamydia or gonorrhea. Ask your health care provider if you are at risk. Ask your health care provider about whether you are at high risk for HIV. Your health care provider may recommend a prescription medicine to help prevent HIV   infection. If you choose to take medicine to prevent HIV, you should first get tested for HIV. You should then be tested every 3 months for as long as you are taking the medicine. Pregnancy If you are about to stop having your period (premenopausal) and you may become pregnant, seek counseling before you get pregnant. Take 400 to 800  micrograms (mcg) of folic acid every day if you become pregnant. Ask for birth control (contraception) if you want to prevent pregnancy. Osteoporosis and menopause Osteoporosis is a disease in which the bones lose minerals and strength with aging. This can result in bone fractures. If you are 73 years old or older, or if you are at risk for osteoporosis and fractures, ask your health care provider if you should: Be screened for bone loss. Take a calcium or vitamin D supplement to lower your risk of fractures. Be given hormone replacement therapy (HRT) to treat symptoms of menopause. Follow these instructions at home: Alcohol use Do not drink alcohol if: Your health care provider tells you not to drink. You are pregnant, may be pregnant, or are planning to become pregnant. If you drink alcohol: Limit how much you have to: 0-1 drink a day. Know how much alcohol is in your drink. In the [place] one drink equals one 12 oz bottle of beer (355 mL), one 5 oz glass of wine (148 mL), or one 1 oz glass of hard liquor (44 mL). Lifestyle Do not use any products that contain nicotine or tobacco. These products include cigarettes, chewing tobacco, and vaping devices, such as e-cigarettes. If you need help quitting, ask your health care provider. Do not use street drugs. Do not share needles. Ask your health care provider for help if you need support or information about quitting drugs. General instructions Schedule regular health, dental, and eye exams. Stay current with your vaccines. Tell your health care provider if: You often feel depressed. You have ever been abused or do not feel safe at home. Summary Adopting a healthy lifestyle and getting preventive care are important in promoting health and wellness. Follow your health care provider's instructions about healthy diet, exercising, and getting tested or screened for diseases. Follow your health care provider's instructions on monitoring your  cholesterol and blood pressure. This information is not intended to replace advice given to you by your health care provider. Make sure you discuss any questions you have with your health care provider. Document Revised: 01/19/2021 Document Reviewed: 01/19/2021 Elsevier Patient Education  [employer].   Goal to exercise 150 minutes per week with at least 2 days of strength training Encouraged to park further when at the store, take stairs instead of elevators and to walk in place during commercials. Increase water intake to at least one gallon of water daily. I encouraged you to strive for BMI less than 30 to decrease cardiac risk.

## 2022-03-11 NOTE — Progress Notes (Signed)
I,Tianna Badgett,acting as a Education administrator for [employer], FNP.,have documented all relevant documentation on the behalf of [name], FNP,as directed by  [name], FNP while in the presence of [name], [place].  Subjective:     Patient ID: Carolyn Barnes , female    DOB: [birth year] , 36 y.o.   MRN: [id number]   Chief Complaint  Patient presents with   Annual Exam    HPI  Patient here for hm. She sees Dr. Marcelline Mates for her GYN needs.      Past Medical History:  Diagnosis Date   Anxiety    Fatigue    GERD (gastroesophageal reflux disease)    History of hiatal hernia    Indigestion    Migraine headache    Seasonal allergies    Vitamin D deficiency disease      Family History  Problem Relation Age of Onset   Heart disease Maternal Grandmother    Diabetes Maternal Grandmother    Healthy Mother    Healthy Daughter    Breast cancer Neg Hx    Ovarian cancer Neg Hx    Colon cancer Neg Hx      Current Outpatient Medications:    fluticasone (FLONASE) 50 MCG/ACT nasal spray, Place 2 sprays into both nostrils daily., Disp: 16 g, Rfl: 0   Plecanatide (TRULANCE) 3 MG TABS, Take 1 tablet by mouth daily at 2 PM., Disp: 90 tablet, Rfl: 1   sertraline (ZOLOFT) 50 MG tablet, TAKE 1 TABLET (50 MG TOTAL) BY MOUTH DAILY. DAY 21 OF CYCLE, Disp: 90 tablet, Rfl: 3   Ubrogepant (UBRELVY) 50 MG TABS, TAKE 1 TABLET BY MOUTH DAILY., Disp: 10 tablet, Rfl: 1   No Known Allergies    The patient states she uses vasectomy for birth control. Patient's last menstrual period was 02/22/2022.. Negative for Dysmenorrhea and Negative for Menorrhagia.  Negative for: breast discharge, breast lump(s), breast pain and breast self exam. Associated symptoms include abnormal vaginal bleeding. Pertinent negatives include abnormal bleeding (hematology), anxiety, decreased libido, depression, difficulty falling sleep, dyspareunia, history of infertility, nocturia, sexual dysfunction, sleep disturbances, urinary  incontinence, urinary urgency, vaginal discharge and vaginal itching. Diet regular.  The patient states her exercise level is minimal - barriers to exercising is caring for the kids with their sports.   The patient's tobacco use is:  Social History   Tobacco Use  Smoking Status Never  Smokeless Tobacco Never   She has been exposed to passive smoke. The patient's alcohol use is:  Social History   Substance and Sexual Activity  Alcohol Use No   Additional information: Last pap 07/25/2020, next one scheduled for 07/26/2023.    Review of Systems  Constitutional: Negative.   HENT: Negative.    Eyes: Negative.   Respiratory: Negative.    Cardiovascular: Negative.   Gastrointestinal: Negative.   Endocrine: Negative.   Genitourinary: Negative.   Musculoskeletal: Negative.   Skin: Negative.        Lump to scalp x 1 year no pain.   Allergic/Immunologic: Negative.   Neurological: Negative.   Hematological: Negative.   Psychiatric/Behavioral: Negative.       Today's Vitals   03/11/22 0843  BP: 120/64  Pulse: 77  Temp: 98.9 F (37.2 C)  TempSrc: Oral  Weight: 159 lb (72.1 kg)  Height: $Remove'4\' 9"'fGdhZJK$  (1.448 m)   Body mass index is 34.41 kg/m.  Wt Readings from Last 3 Encounters:  03/11/22 159 lb (72.1 kg)  12/22/21 154 lb 3.2 oz (69.9  kg)  03/09/21 150 lb 6.4 oz (68.2 kg)    Objective:  Physical Exam Vitals reviewed.  Constitutional:      General: She is not in acute distress.    Appearance: Normal appearance. She is well-developed. She is obese.  HENT:     Head: Normocephalic and atraumatic.     Right Ear: Hearing, tympanic membrane, ear canal and external ear normal. There is no impacted cerumen.     Left Ear: Hearing, tympanic membrane, ear canal and external ear normal. There is no impacted cerumen.     Nose: Nose normal.     Mouth/Throat:     Mouth: Mucous membranes are moist.  Eyes:     General: Lids are normal.     Conjunctiva/sclera: Conjunctivae normal.      Pupils: Pupils are equal, round, and reactive to light.     Funduscopic exam:    Right eye: No papilledema.        Left eye: No papilledema.  Neck:     Thyroid: No thyroid mass.     Vascular: No carotid bruit.  Cardiovascular:     Rate and Rhythm: Normal rate and regular rhythm.     Pulses: Normal pulses.     Heart sounds: Normal heart sounds. No murmur heard. Pulmonary:     Effort: Pulmonary effort is normal. No respiratory distress.     Breath sounds: Normal breath sounds. No wheezing.  Abdominal:     General: Abdomen is flat. Bowel sounds are normal. There is no distension.     Palpations: Abdomen is soft.     Tenderness: There is no abdominal tenderness.  Genitourinary:    Comments: Deferred - followed by GYN Musculoskeletal:        General: No swelling or tenderness. Normal range of motion.     Cervical back: Full passive range of motion without pain, normal range of motion and neck supple.     Right lower leg: No edema.     Left lower leg: No edema.  Skin:    General: Skin is warm and dry.     Capillary Refill: Capillary refill takes less than 2 seconds.     Comments: Semi firm lump to posterior crown of scalp, nontender.  Neurological:     General: No focal deficit present.     Mental Status: She is alert and oriented to person, place, and time.     Cranial Nerves: No cranial nerve deficit.     Sensory: No sensory deficit.     Motor: No weakness.  Psychiatric:        Mood and Affect: Mood normal.        Behavior: Behavior normal.        Thought Content: Thought content normal.        Judgment: Judgment normal.         Assessment And Plan:     1. Encounter for general adult medical examination w/o abnormal findings Behavior modifications discussed and diet history reviewed.   Pt will continue to exercise regularly and modify diet with low GI, plant based foods and decrease intake of processed foods.  Recommend intake of daily multivitamin, Vitamin D, and  calcium.  Recommend for preventive screenings, as well as recommend immunizations that include influenza, TDAP Encouraged to do self breast exams monthly the week after menstrual cycle  2. Class 1 obesity due to excess calories without serious comorbidity with body mass index (BMI) of 34.0 to 34.9 in adult Chronic Discussed  healthy diet and regular exercise options  with at least 2 days of strength training Encouraged to park further when at the store, take stairs instead of elevators and to walk in place during commercials. Increase water intake to at least one gallon of water daily. She is encouraged to strive for BMI less than 30 to decrease cardiac risk.  3. Major depressive disorder with single episode, in full remission ([place]) Comments: Tolerating Zoloft well helps with her PMDD, given by Dr. Marcelline Mates GYN - CMP14+EGFR  4. Vitamin D deficiency Will check vitamin D level and supplement as needed.    Also encouraged to spend 15 minutes in the sun daily.  - VITAMIN D 25 Hydroxy (Vit-D Deficiency, Fractures)  5. Elevated LDL cholesterol level Comments: Slightly elevated LDL, continue limiting intake of fried and fatty foods.  - Lipid panel  6. Scalp lump Comments: Semi firm lump to posterior crown of scalp, non tender. Will refer to Dermatology - Ambulatory referral to Dermatology  7. Other long term (current) drug therapy - CBC     Patient was given opportunity to ask questions. Patient verbalized understanding of the plan and was able to repeat key elements of the plan. All questions were answered to their satisfaction.   [name], FNP   I, [name], FNP, have reviewed all documentation for this visit. The documentation on 03/11/22 for the exam, diagnosis, procedures, and orders are all accurate and complete.  THE PATIENT IS ENCOURAGED TO PRACTICE SOCIAL DISTANCING DUE TO THE COVID-19 PANDEMIC.

## 2022-03-12 LAB — LIPID PANEL
Chol/HDL Ratio: 2.9 ratio (ref 0.0–4.4)
Cholesterol, Total: 200 mg/dL — ABNORMAL HIGH (ref 100–199)
HDL: 70 mg/dL (ref >39)
LDL Chol Calc (NIH): 117 mg/dL — ABNORMAL HIGH (ref 0–99)
Triglycerides: 74 mg/dL (ref 0–149)
VLDL Cholesterol Cal: 13 mg/dL (ref 5–40)

## 2022-03-12 LAB — CBC
Hematocrit: 40.4 % (ref 34.0–46.6)
Hemoglobin: 13.6 g/dL (ref 11.1–15.9)
MCH: 32.2 pg (ref 26.6–33.0)
MCHC: 33.7 g/dL (ref 31.5–35.7)
MCV: 96 fL (ref 79–97)
Platelets: 239 10*3/uL (ref 150–450)
RBC: 4.23 x10E6/uL (ref 3.77–5.28)
RDW: 11.7 % (ref 11.7–15.4)
WBC: 3.9 10*3/uL (ref 3.4–10.8)

## 2022-03-12 LAB — VITAMIN D 25 HYDROXY (VIT D DEFICIENCY, FRACTURES): Vit D, 25-Hydroxy: 33.4 ng/mL (ref 30.0–100.0)

## 2022-03-12 LAB — CMP14+EGFR
ALT: 19 IU/L (ref 0–32)
AST: 14 IU/L (ref 0–40)
Albumin/Globulin Ratio: 1.5 (ref 1.2–2.2)
Albumin: 4.5 g/dL (ref 3.8–4.8)
Alkaline Phosphatase: 48 IU/L (ref 44–121)
BUN/Creatinine Ratio: 10 (ref 9–23)
BUN: 9 mg/dL (ref 6–20)
Bilirubin Total: 0.3 mg/dL (ref 0.0–1.2)
CO2: 20 mmol/L (ref 20–29)
Calcium: 9.3 mg/dL (ref 8.7–10.2)
Chloride: 104 mmol/L (ref 96–106)
Creatinine, Ser: 0.89 mg/dL (ref 0.57–1.00)
Globulin, Total: 3 g/dL (ref 1.5–4.5)
Glucose: 82 mg/dL (ref 70–99)
Potassium: 4.3 mmol/L (ref 3.5–5.2)
Sodium: 138 mmol/L (ref 134–144)
Total Protein: 7.5 g/dL (ref 6.0–8.5)
eGFR: 87 mL/min/{1.73_m2} (ref >59)

## 2022-04-19 ENCOUNTER — Other Ambulatory Visit: Payer: Self-pay

## 2022-04-26 NOTE — Progress Notes (Unsigned)
GYNECOLOGY ANNUAL PHYSICAL EXAM PROGRESS NOTE  Subjective:    Carolyn Barnes is a 36 y.o. [phone] female who presents for an annual exam. The patient has no complaints today. The patient is sexually active. The patient participates in regular exercise: yes. Has the patient ever been transfused or tattooed?: {yes/no/not asked:9010}. The patient reports that there is not domestic violence in her life.    Menstrual History: Menarche age: 50 No LMP recorded.     Gynecologic History:  Contraception: vasectomy History of STI's: Denies Last Pap: 07/25/2020. Results were: normal.  Denies h/o abnormal pap smears.   OB History  Gravida Para Term Preterm AB Living  '4 2 2 '$ 0 2 2  SAB IAB Ectopic Multiple Live Births  2 0 0 0 2    # Outcome Date GA Lbr Len/2nd Weight Sex Delivery Anes PTL Lv  4 Term 05/29/15 [redacted]w[redacted]d 7 lb 8.3 oz (3.41 kg) F Vag-Spont  Y LIV  3 SAB 2015     SAB   FD  2 SAB 2014     SAB   FD  1 Term 2010 448w1d6 lb 2 oz (2.778 kg) F Vag-Spont  N LIV     Name: [name]  Past Medical History:  Diagnosis Date   Anxiety    Fatigue    GERD (gastroesophageal reflux disease)    History of hiatal hernia    Indigestion    Migraine headache    Seasonal allergies    Vitamin D deficiency disease     Past Surgical History:  Procedure Laterality Date   DILATION AND CURETTAGE OF UTERUS  02/2013,10/2013   ESOPHAGOGASTRODUODENOSCOPY  02/2014   Dr WoAllen Norris small HH and gastritis   XI ROBOTIC ASSISTED VENTRAL HERNIA N/A 09/04/2019   Procedure: XI ROBOTIC ASSISTED VENTRAL HERNIA;  Surgeon: PaJules HusbandsMD;  Location: [facility] OR[facility];  Service: General;  Laterality: N/A;    Family History  Problem Relation Age of Onset   Heart disease Maternal Grandmother    Diabetes Maternal Grandmother    Healthy Mother    Healthy Daughter    Breast cancer Neg Hx    Ovarian cancer Neg Hx    Colon cancer Neg Hx     Social History   Socioeconomic History   Marital status: Married     Spouse name: Not on file   Number of children: 2   Years of education: Not on file   Highest education level: Not on file  Occupational History   Not on file  Tobacco Use   Smoking status: Never   Smokeless tobacco: Never  Vaping Use   Vaping Use: Never used  Substance and Sexual Activity   Alcohol use: No   Drug use: No   Sexual activity: Yes    Birth control/protection: None, Other-see comments    Comment: Husband-vasectomy  Other Topics Concern   Not on file  Social History Narrative   Not on file   Social Determinants of Health   Financial Resource Strain: Not on file  Food Insecurity: Not on file  Transportation Needs: Not on file  Physical Activity: Not on file  Stress: Not on file  Social Connections: Not on file  Intimate Partner Violence: Not on file    Current Outpatient Medications on File Prior to Visit  Medication Sig Dispense Refill   fluticasone (FLONASE) 50 MCG/ACT nasal spray Place 2 sprays into both nostrils daily. 16 g 0   Plecanatide (  TRULANCE) 3 MG TABS Take 1 tablet by mouth daily at 2 PM. 90 tablet 1   sertraline (ZOLOFT) 50 MG tablet TAKE 1 TABLET (50 MG TOTAL) BY MOUTH DAILY. DAY 21 OF CYCLE 90 tablet 3   Ubrogepant (UBRELVY) 50 MG TABS TAKE 1 TABLET BY MOUTH DAILY. 10 tablet 1   No current facility-administered medications on file prior to visit.    No Known Allergies   Review of Systems Constitutional: negative for chills, fatigue, fevers and sweats Eyes: negative for irritation, redness and visual disturbance Ears, nose, mouth, throat, and face: negative for hearing loss, nasal congestion, snoring and tinnitus Respiratory: negative for asthma, cough, sputum Cardiovascular: negative for chest pain, dyspnea, exertional chest pressure/discomfort, irregular heart beat, palpitations and syncope Gastrointestinal: negative for abdominal pain, change in bowel habits, nausea and vomiting Genitourinary: negative for abnormal menstrual periods,  genital lesions, sexual problems and vaginal discharge, dysuria and urinary incontinence Integument/breast: negative for breast lump, breast tenderness and nipple discharge Hematologic/lymphatic: negative for bleeding and easy bruising Musculoskeletal:negative for back pain and muscle weakness Neurological: negative for dizziness, headaches, vertigo and weakness Endocrine: negative for diabetic symptoms including polydipsia, polyuria and skin dryness Allergic/Immunologic: negative for hay fever and urticaria      Objective:  There were no vitals taken for this visit. There is no height or weight on file to calculate BMI.    General Appearance:    Alert, cooperative, no distress, appears stated age  Head:    Normocephalic, without obvious abnormality, atraumatic  Eyes:    PERRL, conjunctiva/corneas clear, EOM's intact, both eyes  Ears:    Normal external ear canals, both ears  Nose:   Nares normal, septum midline, mucosa normal, no drainage or sinus tenderness  Throat:   Lips, mucosa, and tongue normal; teeth and gums normal  Neck:   Supple, symmetrical, trachea midline, no adenopathy; thyroid: no enlargement/tenderness/nodules; no carotid bruit or JVD  Back:     Symmetric, no curvature, ROM normal, no CVA tenderness  Lungs:     Clear to auscultation bilaterally, respirations unlabored  Chest Wall:    No tenderness or deformity   Heart:    Regular rate and rhythm, S1 and S2 normal, no murmur, rub or gallop  Breast Exam:    No tenderness, masses, or nipple abnormality  Abdomen:     Soft, non-tender, bowel sounds active all four quadrants, no masses, no organomegaly.    Genitalia:    Pelvic:external genitalia normal, vagina without lesions, discharge, or tenderness, rectovaginal septum  normal. Cervix normal in appearance, no cervical motion tenderness, no adnexal masses or tenderness.  Uterus normal size, shape, mobile, regular contours, nontender.  Rectal:    Normal external sphincter.  No  hemorrhoids appreciated. Internal exam not done.   Extremities:   Extremities normal, atraumatic, no cyanosis or edema  Pulses:   2+ and symmetric all extremities  Skin:   Skin color, texture, turgor normal, no rashes or lesions  Lymph nodes:   Cervical, supraclavicular, and axillary nodes normal  Neurologic:   CNII-XII intact, normal strength, sensation and reflexes throughout   .  Labs:  Lab Results  Component Value Date   WBC 3.9 03/11/2022   HGB 13.6 03/11/2022   HCT 40.4 03/11/2022   MCV 96 03/11/2022   PLT 239 03/11/2022    Lab Results  Component Value Date   CREATININE 0.89 03/11/2022   BUN 9 03/11/2022   NA 138 03/11/2022   K 4.3 03/11/2022   CL 104  03/11/2022   CO2 20 03/11/2022    Lab Results  Component Value Date   ALT 19 03/11/2022   AST 14 03/11/2022   ALKPHOS 48 03/11/2022   BILITOT 0.3 03/11/2022    Lab Results  Component Value Date   TSH 1.490 03/07/2020     Assessment:   No diagnosis found.   Plan:  Blood tests: {blood tests:13147}. Breast self exam technique reviewed and patient encouraged to perform self-exam monthly. Contraception: vasectomy. Discussed healthy lifestyle modifications. Mammogram  : Not age appropriate Pap smear  UTD . COVID vaccination status: Follow up in 1 year for annual exam   [name], MD [facility]

## 2022-04-28 ENCOUNTER — Encounter: Payer: Self-pay | Admitting: Obstetrics and Gynecology

## 2022-04-28 ENCOUNTER — Ambulatory Visit (INDEPENDENT_AMBULATORY_CARE_PROVIDER_SITE_OTHER): Payer: No Typology Code available for payment source | Admitting: Obstetrics and Gynecology

## 2022-04-28 ENCOUNTER — Other Ambulatory Visit: Payer: Self-pay

## 2022-04-28 VITALS — BP 115/51 | HR 79 | Ht <= 58 in | Wt 164.0 lb

## 2022-04-28 DIAGNOSIS — N926 Irregular menstruation, unspecified: Secondary | ICD-10-CM

## 2022-04-28 DIAGNOSIS — Z01419 Encounter for gynecological examination (general) (routine) without abnormal findings: Secondary | ICD-10-CM | POA: Diagnosis not present

## 2022-04-28 DIAGNOSIS — F3281 Premenstrual dysphoric disorder: Secondary | ICD-10-CM | POA: Diagnosis not present

## 2022-04-28 MED ORDER — PHENTERMINE HCL 15 MG PO CAPS
15.0000 mg | ORAL_CAPSULE | ORAL | 2 refills | Status: DC
  Filled 2022-04-28: qty 30, 30d supply, fill #0

## 2022-04-28 NOTE — Progress Notes (Incomplete)
GYNECOLOGY ANNUAL PHYSICAL EXAM PROGRESS NOTE  Subjective:    Carolyn Barnes is a 36 y.o. [phone] female who presents for an annual exam.  The patient is sexually active. The patient participates in regular exercise: yes. Has the patient ever been transfused or tattooed?: yes. The patient reports that there is not domestic violence in her life.   The patient has the following complaints today: Reports that she would like to discuss better control of her menstrual cycles. Notes they are more irregular, coming every 21-60 days. Sometimes cycles are very light (uses a panytliner), other times can be very heavy. Has been ongoing since August.  Also desires to discuss weight loss. Feels that she is at her highest weight since her last pregnancy 6 years ago.  Has utilized weight loss medications in the past, including Phentermine (notes side effects but thinks it was due to being started with a high dose), and Wegovy (worked well but did not think it was healthy for her as she had such appetite suppression that she may go 1-2 days without eating).    Menstrual History: Menarche age: 30 Patient's last menstrual period was 04/15/2022 (exact date).    Gynecologic History:  Contraception: vasectomy History of STI's: Denies Last Pap: 07/25/2020. Results were: normal.  Denies h/o abnormal pap smears.   OB History  Gravida Para Term Preterm AB Living  '4 2 2 '$ 0 2 2  SAB IAB Ectopic Multiple Live Births  2 0 0 0 2    # Outcome Date GA Lbr Len/2nd Weight Sex Delivery Anes PTL Lv  4 Term 05/29/15 [redacted]w[redacted]d 7 lb 8.3 oz (3.41 kg) F Vag-Spont  Y LIV  3 SAB 2015     SAB   FD  2 SAB 2014     SAB   FD  1 Term 2010 487w1d6 lb 2 oz (2.778 kg) F Vag-Spont  N LIV     Name: Carolyn Barnes  Past Medical History:  Diagnosis Date  . Anxiety   . Fatigue   . GERD (gastroesophageal reflux disease)   . History of hiatal hernia   . Indigestion   . Migraine headache   . Seasonal allergies   . Vitamin D  deficiency disease     Past Surgical History:  Procedure Laterality Date  . DILATION AND CURETTAGE OF UTERUS  02/2013,10/2013  . ESOPHAGOGASTRODUODENOSCOPY  02/2014   Dr WoAllen Norris small HH and gastritis  . XI ROBOTIC ASSISTED VENTRAL HERNIA N/A 09/04/2019   Procedure: XI ROBOTIC ASSISTED VENTRAL HERNIA;  Surgeon: PaJules HusbandsMD;  Location: [facility] ORS;  Service: General;  Laterality: N/A;    Family History  Problem Relation Age of Onset  . Heart disease Maternal Grandmother   . Diabetes Maternal Grandmother   . Healthy Mother   . Healthy Daughter   . Breast cancer Neg Hx   . Ovarian cancer Neg Hx   . Colon cancer Neg Hx     Social History   Socioeconomic History  . Marital status: Married    Spouse name: Not on file  . Number of children: 2  . Years of education: Not on file  . Highest education level: Not on file  Occupational History  . Not on file  Tobacco Use  . Smoking status: Never  . Smokeless tobacco: Never  Vaping Use  . Vaping Use: Never used  Substance and Sexual Activity  . Alcohol use: No  . Drug use: No  .  Sexual activity: Yes    Birth control/protection: None, Other-see comments    Comment: Husband-vasectomy  Other Topics Concern  . Not on file  Social History Narrative  . Not on file   Social Determinants of Health   Financial Resource Strain: Not on file  Food Insecurity: Not on file  Transportation Needs: Not on file  Physical Activity: Not on file  Stress: Not on file  Social Connections: Not on file  Intimate Partner Violence: Not on file    Current Outpatient Medications on File Prior to Visit  Medication Sig Dispense Refill  . fluticasone (FLONASE) 50 MCG/ACT nasal spray Place 2 sprays into both nostrils daily. 16 g 0  . Plecanatide (TRULANCE) 3 MG TABS Take 1 tablet by mouth daily at 2 PM. 90 tablet 1  . sertraline (ZOLOFT) 50 MG tablet TAKE 1 TABLET (50 MG TOTAL) BY MOUTH DAILY. DAY 21 OF CYCLE 90 tablet 3  . Ubrogepant (UBRELVY) 50  MG TABS TAKE 1 TABLET BY MOUTH DAILY. 10 tablet 1   No current facility-administered medications on file prior to visit.    No Known Allergies   Review of Systems Constitutional: negative for chills, fatigue, fevers and sweats, weight gain Eyes: negative for irritation, redness and visual disturbance Ears, nose, mouth, throat, and face: negative for hearing loss, nasal congestion, snoring and tinnitus Respiratory: negative for asthma, cough, sputum Cardiovascular: negative for chest pain, dyspnea, exertional chest pressure/discomfort, irregular heart beat, palpitations and syncope Gastrointestinal: negative for abdominal pain, change in bowel habits, nausea and vomiting Genitourinary: positive for abnormal menstrual periods. No genital lesions, sexual problems and vaginal discharge, dysuria and urinary incontinence Integument/breast: negative for breast lump, breast tenderness and nipple discharge Hematologic/lymphatic: negative for bleeding and easy bruising Musculoskeletal:negative for back pain and muscle weakness Neurological: negative for dizziness, headaches, vertigo and weakness Endocrine: negative for diabetic symptoms including polydipsia, polyuria and skin dryness Allergic/Immunologic: negative for hay fever and urticaria      Objective:  Blood pressure (!) 115/51, pulse 79, height 4' 9.5" (1.461 m), weight 164 lb (74.4 kg), last menstrual period 04/15/2022.  Body mass index is 34.87 kg/m.    General Appearance:    Alert, cooperative, no distress, appears stated age  Head:    Normocephalic, without obvious abnormality, atraumatic  Eyes:    PERRL, conjunctiva/corneas clear, EOM's intact, both eyes  Ears:    Normal external ear canals, both ears  Nose:   Nares normal, septum midline, mucosa normal, no drainage or sinus tenderness  Throat:   Lips, mucosa, and tongue normal; teeth and gums normal  Neck:   Supple, symmetrical, trachea midline, no adenopathy; thyroid: no  enlargement/tenderness/nodules; no carotid bruit or JVD  Back:     Symmetric, no curvature, ROM normal, no CVA tenderness  Lungs:     Clear to auscultation bilaterally, respirations unlabored  Chest Wall:    No tenderness or deformity   Heart:    Regular rate and rhythm, S1 and S2 normal, no murmur, rub or gallop  Breast Exam:    Deferred  Abdomen:     Soft, non-tender, bowel sounds active all four quadrants, no masses, no organomegaly.    Genitalia:   Deferred, patient preference  Extremities:   Extremities normal, atraumatic, no cyanosis or edema  Pulses:   2+ and symmetric all extremities  Skin:   Skin color, texture, turgor normal, no rashes or lesions  Lymph nodes:   Cervical, supraclavicular, and axillary nodes normal  Neurologic:   CNII-XII  intact, normal strength, sensation and reflexes throughout   .  Labs:  Lab Results  Component Value Date   WBC 3.9 03/11/2022   HGB 13.6 03/11/2022   HCT 40.4 03/11/2022   MCV 96 03/11/2022   PLT 239 03/11/2022    Lab Results  Component Value Date   CREATININE 0.89 03/11/2022   BUN 9 03/11/2022   NA 138 03/11/2022   K 4.3 03/11/2022   CL 104 03/11/2022   CO2 20 03/11/2022    Lab Results  Component Value Date   ALT 19 03/11/2022   AST 14 03/11/2022   ALKPHOS 48 03/11/2022   BILITOT 0.3 03/11/2022    Lab Results  Component Value Date   TSH 1.490 03/07/2020     Assessment:   1. Well woman exam with routine gynecological exam   2. PMDD (premenstrual dysphoric disorder)   3. Irregular bleeding   4. Irregular menses      Plan:  Blood tests: Labs performed by PCP. Breast self exam technique reviewed and patient encouraged to perform self-exam monthly. Contraception: vasectomy.  Discussed healthy lifestyle modifications. Discussed weight loss options with patient, can consider trial of Phentermine at lower dose, patient ok to trial.  Mammogram  : Not age appropriate .  Pap smear  UTD . Discussed management options  for irregular menses, patient ok to trial OCPs. Will give low dose (Lo Loestrin),  Follow up in 1 year for annual exam   [name], MD [facility]

## 2022-04-29 ENCOUNTER — Other Ambulatory Visit: Payer: Self-pay | Admitting: Nurse Practitioner

## 2022-04-29 ENCOUNTER — Encounter: Payer: Self-pay | Admitting: Obstetrics and Gynecology

## 2022-04-29 DIAGNOSIS — K5909 Other constipation: Secondary | ICD-10-CM

## 2022-04-30 ENCOUNTER — Other Ambulatory Visit: Payer: Self-pay

## 2022-04-30 MED ORDER — TRULANCE 3 MG PO TABS
1.0000 | ORAL_TABLET | Freq: Every day | ORAL | 1 refills | Status: AC
  Filled 2022-04-30: qty 90, 90d supply, fill #0

## 2022-06-21 ENCOUNTER — Ambulatory Visit: Payer: No Typology Code available for payment source | Admitting: Gastroenterology

## 2022-07-29 ENCOUNTER — Telehealth: Payer: Self-pay | Admitting: Obstetrics and Gynecology

## 2022-07-30 ENCOUNTER — Telehealth: Payer: Self-pay | Admitting: Obstetrics and Gynecology

## 2022-07-30 NOTE — Progress Notes (Deleted)
    GYNECOLOGY PROGRESS NOTE  Subjective:    Patient ID: Carolyn Barnes, female    DOB: Dec 23, 1985, 36 y.o.   MRN: [id number]  HPI  Patient is a 36 y.o. female who presents for {NUMBER 1-10:22536} month weight management follow up. She has a past history of obesity, ***. She initiated use of *** {NUMBER 1-10:22536} months ago.  Denies any undesirable side effects and reports compliance with medications.    Current interventions:  1. Diet -  2. Activity -  3. Reports bowel movements are ***.    {Common ambulatory SmartLinks:19316}  Review of Systems {ros; complete:30496}   Objective:       04/28/2022   11:28 AM 03/11/2022    8:43 AM 12/22/2021    4:37 PM  Vitals with BMI  Height 4' 9.5" '4\' 9"'$  '4\' 9"'$   Weight 164 lbs 159 lbs 154 lbs 3 oz  BMI 34.85 67.6 19.50  Systolic 932 671 245  Diastolic 51 64 80  Pulse 79 77 84    General appearance: {general exam:16600} Abdomen: soft, non-tender.  Waist circumference *** in.    Labs:   Assessment:   Weight management Obesity, There is no height or weight on file to calculate BMI.  Plan:   Weight management  - doing well with weight loss, can continue current management.      [name], MD [place]

## 2022-10-20 ENCOUNTER — Ambulatory Visit: Payer: Self-pay

## 2022-12-27 ENCOUNTER — Other Ambulatory Visit: Payer: Self-pay

## 2022-12-27 DIAGNOSIS — F429 Obsessive-compulsive disorder, unspecified: Secondary | ICD-10-CM | POA: Diagnosis not present

## 2022-12-27 DIAGNOSIS — F3342 Major depressive disorder, recurrent, in full remission: Secondary | ICD-10-CM | POA: Diagnosis not present

## 2022-12-27 DIAGNOSIS — F3281 Premenstrual dysphoric disorder: Secondary | ICD-10-CM | POA: Diagnosis not present

## 2022-12-27 DIAGNOSIS — F902 Attention-deficit hyperactivity disorder, combined type: Secondary | ICD-10-CM | POA: Diagnosis not present

## 2022-12-27 MED ORDER — AMPHETAMINE-DEXTROAMPHET ER 5 MG PO CP24
5.0000 mg | ORAL_CAPSULE | Freq: Every day | ORAL | 0 refills | Status: DC | PRN
  Filled 2022-12-27: qty 60, 60d supply, fill #0

## 2022-12-28 ENCOUNTER — Other Ambulatory Visit: Payer: Self-pay

## 2022-12-29 ENCOUNTER — Other Ambulatory Visit: Payer: Self-pay

## 2023-01-19 ENCOUNTER — Ambulatory Visit: Payer: 59 | Admitting: Nurse Practitioner

## 2023-01-26 ENCOUNTER — Other Ambulatory Visit: Payer: Self-pay

## 2023-01-26 DIAGNOSIS — F902 Attention-deficit hyperactivity disorder, combined type: Secondary | ICD-10-CM | POA: Diagnosis not present

## 2023-01-26 DIAGNOSIS — F3342 Major depressive disorder, recurrent, in full remission: Secondary | ICD-10-CM | POA: Diagnosis not present

## 2023-01-26 DIAGNOSIS — F429 Obsessive-compulsive disorder, unspecified: Secondary | ICD-10-CM | POA: Diagnosis not present

## 2023-01-26 DIAGNOSIS — F3281 Premenstrual dysphoric disorder: Secondary | ICD-10-CM | POA: Diagnosis not present

## 2023-01-26 MED ORDER — AMPHETAMINE-DEXTROAMPHET ER 5 MG PO CP24
5.0000 mg | ORAL_CAPSULE | Freq: Every day | ORAL | 0 refills | Status: DC
  Filled 2023-01-26: qty 30, 30d supply, fill #0

## 2023-01-26 MED ORDER — AMPHETAMINE-DEXTROAMPHET ER 10 MG PO CP24
10.0000 mg | ORAL_CAPSULE | Freq: Every morning | ORAL | 0 refills | Status: DC
  Filled 2023-01-26: qty 30, 30d supply, fill #0

## 2023-01-28 DIAGNOSIS — M9901 Segmental and somatic dysfunction of cervical region: Secondary | ICD-10-CM | POA: Diagnosis not present

## 2023-01-28 DIAGNOSIS — M9903 Segmental and somatic dysfunction of lumbar region: Secondary | ICD-10-CM | POA: Diagnosis not present

## 2023-01-28 DIAGNOSIS — M62830 Muscle spasm of back: Secondary | ICD-10-CM | POA: Diagnosis not present

## 2023-01-28 DIAGNOSIS — R293 Abnormal posture: Secondary | ICD-10-CM | POA: Diagnosis not present

## 2023-02-02 ENCOUNTER — Ambulatory Visit: Payer: 59 | Admitting: Nurse Practitioner

## 2023-02-02 DIAGNOSIS — M9901 Segmental and somatic dysfunction of cervical region: Secondary | ICD-10-CM | POA: Diagnosis not present

## 2023-02-02 DIAGNOSIS — R293 Abnormal posture: Secondary | ICD-10-CM | POA: Diagnosis not present

## 2023-02-02 DIAGNOSIS — M62830 Muscle spasm of back: Secondary | ICD-10-CM | POA: Diagnosis not present

## 2023-02-02 DIAGNOSIS — M9903 Segmental and somatic dysfunction of lumbar region: Secondary | ICD-10-CM | POA: Diagnosis not present

## 2023-02-04 DIAGNOSIS — M9901 Segmental and somatic dysfunction of cervical region: Secondary | ICD-10-CM | POA: Diagnosis not present

## 2023-02-04 DIAGNOSIS — R293 Abnormal posture: Secondary | ICD-10-CM | POA: Diagnosis not present

## 2023-02-04 DIAGNOSIS — M9903 Segmental and somatic dysfunction of lumbar region: Secondary | ICD-10-CM | POA: Diagnosis not present

## 2023-02-04 DIAGNOSIS — M62830 Muscle spasm of back: Secondary | ICD-10-CM | POA: Diagnosis not present

## 2023-02-09 DIAGNOSIS — M9903 Segmental and somatic dysfunction of lumbar region: Secondary | ICD-10-CM | POA: Diagnosis not present

## 2023-02-09 DIAGNOSIS — M62830 Muscle spasm of back: Secondary | ICD-10-CM | POA: Diagnosis not present

## 2023-02-09 DIAGNOSIS — M9901 Segmental and somatic dysfunction of cervical region: Secondary | ICD-10-CM | POA: Diagnosis not present

## 2023-02-09 DIAGNOSIS — R293 Abnormal posture: Secondary | ICD-10-CM | POA: Diagnosis not present

## 2023-02-11 DIAGNOSIS — M62830 Muscle spasm of back: Secondary | ICD-10-CM | POA: Diagnosis not present

## 2023-02-11 DIAGNOSIS — M9903 Segmental and somatic dysfunction of lumbar region: Secondary | ICD-10-CM | POA: Diagnosis not present

## 2023-02-11 DIAGNOSIS — R293 Abnormal posture: Secondary | ICD-10-CM | POA: Diagnosis not present

## 2023-02-11 DIAGNOSIS — M9901 Segmental and somatic dysfunction of cervical region: Secondary | ICD-10-CM | POA: Diagnosis not present

## 2023-02-16 DIAGNOSIS — R293 Abnormal posture: Secondary | ICD-10-CM | POA: Diagnosis not present

## 2023-02-16 DIAGNOSIS — M9901 Segmental and somatic dysfunction of cervical region: Secondary | ICD-10-CM | POA: Diagnosis not present

## 2023-02-16 DIAGNOSIS — M9903 Segmental and somatic dysfunction of lumbar region: Secondary | ICD-10-CM | POA: Diagnosis not present

## 2023-02-16 DIAGNOSIS — M62830 Muscle spasm of back: Secondary | ICD-10-CM | POA: Diagnosis not present

## 2023-02-23 ENCOUNTER — Other Ambulatory Visit: Payer: Self-pay

## 2023-02-23 DIAGNOSIS — R293 Abnormal posture: Secondary | ICD-10-CM | POA: Diagnosis not present

## 2023-02-23 DIAGNOSIS — F3281 Premenstrual dysphoric disorder: Secondary | ICD-10-CM | POA: Diagnosis not present

## 2023-02-23 DIAGNOSIS — M62830 Muscle spasm of back: Secondary | ICD-10-CM | POA: Diagnosis not present

## 2023-02-23 DIAGNOSIS — F429 Obsessive-compulsive disorder, unspecified: Secondary | ICD-10-CM | POA: Diagnosis not present

## 2023-02-23 DIAGNOSIS — M9903 Segmental and somatic dysfunction of lumbar region: Secondary | ICD-10-CM | POA: Diagnosis not present

## 2023-02-23 DIAGNOSIS — F902 Attention-deficit hyperactivity disorder, combined type: Secondary | ICD-10-CM | POA: Diagnosis not present

## 2023-02-23 DIAGNOSIS — F3342 Major depressive disorder, recurrent, in full remission: Secondary | ICD-10-CM | POA: Diagnosis not present

## 2023-02-23 DIAGNOSIS — M9901 Segmental and somatic dysfunction of cervical region: Secondary | ICD-10-CM | POA: Diagnosis not present

## 2023-02-23 MED ORDER — METHYLPHENIDATE HCL ER (OSM) 18 MG PO TBCR
18.0000 mg | EXTENDED_RELEASE_TABLET | Freq: Every morning | ORAL | 0 refills | Status: DC
  Filled 2023-02-23: qty 30, 30d supply, fill #0

## 2023-03-02 DIAGNOSIS — M9903 Segmental and somatic dysfunction of lumbar region: Secondary | ICD-10-CM | POA: Diagnosis not present

## 2023-03-02 DIAGNOSIS — M62830 Muscle spasm of back: Secondary | ICD-10-CM | POA: Diagnosis not present

## 2023-03-02 DIAGNOSIS — M9901 Segmental and somatic dysfunction of cervical region: Secondary | ICD-10-CM | POA: Diagnosis not present

## 2023-03-02 DIAGNOSIS — R293 Abnormal posture: Secondary | ICD-10-CM | POA: Diagnosis not present

## 2023-03-09 DIAGNOSIS — M9901 Segmental and somatic dysfunction of cervical region: Secondary | ICD-10-CM | POA: Diagnosis not present

## 2023-03-09 DIAGNOSIS — R293 Abnormal posture: Secondary | ICD-10-CM | POA: Diagnosis not present

## 2023-03-09 DIAGNOSIS — M9903 Segmental and somatic dysfunction of lumbar region: Secondary | ICD-10-CM | POA: Diagnosis not present

## 2023-03-09 DIAGNOSIS — M62830 Muscle spasm of back: Secondary | ICD-10-CM | POA: Diagnosis not present

## 2023-03-16 ENCOUNTER — Encounter: Payer: Self-pay | Admitting: Nurse Practitioner

## 2023-03-16 ENCOUNTER — Ambulatory Visit (INDEPENDENT_AMBULATORY_CARE_PROVIDER_SITE_OTHER): Payer: 59 | Admitting: Nurse Practitioner

## 2023-03-16 VITALS — BP 120/64 | HR 77 | Temp 98.4°F | Ht <= 58 in | Wt 162.4 lb

## 2023-03-16 DIAGNOSIS — E559 Vitamin D deficiency, unspecified: Secondary | ICD-10-CM

## 2023-03-16 DIAGNOSIS — Z6835 Body mass index (BMI) 35.0-35.9, adult: Secondary | ICD-10-CM | POA: Insufficient documentation

## 2023-03-16 DIAGNOSIS — Z2821 Immunization not carried out because of patient refusal: Secondary | ICD-10-CM

## 2023-03-16 DIAGNOSIS — R0683 Snoring: Secondary | ICD-10-CM

## 2023-03-16 DIAGNOSIS — R5383 Other fatigue: Secondary | ICD-10-CM | POA: Diagnosis not present

## 2023-03-16 NOTE — Progress Notes (Signed)
Carolyn Barnes, CMA,acting as a Neurosurgeon for Carolyn Felts, FNP.,have documented all relevant documentation on the behalf of Carolyn Felts, FNP,as directed by  Carolyn Felts, FNP while in the presence of Carolyn Felts, FNP.  Subjective:  Patient ID: Carolyn Barnes , female    DOB: [birth year] , 37 y.o.   MRN: [id number]  Chief Complaint  Patient presents with   Snoring    HPI  Patient presents today for snoring, patient reports that her snoring has gotten worse. Patient reports her husband states that she sometimes stops breathing or gasp for air.  Patient reports compliance with medications. Patient has no other concerns today, patient denies any chest pain, SOB, or headaches. 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   BP Readings from Last 3 Encounters: 03/16/23 : 120/64 04/28/22 : (!) 115/51 03/11/22 : 120/64    Wt Readings from Last 3 Encounters: 03/16/23 : 162 lb 6.4 oz (73.7 kg) 04/28/22 : 164 lb (74.4 kg) 03/11/22 : 159 lb (72.1 kg)       Past Medical History:  Diagnosis Date   Anxiety    Fatigue    GERD (gastroesophageal reflux disease)    History of hiatal hernia    Indigestion    Migraine headache    Seasonal allergies    Vitamin D deficiency disease      Family History  Problem Relation Age of Onset   Heart disease Maternal Grandmother    Diabetes Maternal Grandmother    Healthy Mother    Healthy Daughter    Breast cancer Neg Hx    Ovarian cancer Neg Hx    Colon cancer Neg Hx      Current Outpatient Medications:    fluticasone (FLONASE) 50 MCG/ACT nasal spray, Place 2 sprays into both nostrils daily., Disp: 16 g, Rfl: 0   methylphenidate 18 MG PO CR tablet, Take 1 tablet (18 mg total) by mouth every morning as needed for inattention., Disp: 30 tablet, Rfl: 0   Plecanatide (TRULANCE) 3 MG TABS, Take 1 tablet by mouth daily at 2 PM., Disp: 90 tablet, Rfl: 1   Ubrogepant (UBRELVY) 50 MG TABS, TAKE 1 TABLET BY MOUTH DAILY., Disp: 10 tablet,  Rfl: 1   No Known Allergies   Review of Systems  Constitutional: Negative.   Respiratory: Negative.    Cardiovascular: Negative.  Negative for chest pain, palpitations and leg swelling.  Psychiatric/Behavioral: Negative.       Today's Vitals   03/16/23 1634  BP: 120/64  Pulse: 77  Temp: 98.4 F (36.9 C)  TempSrc: Oral  Weight: 162 lb 6.4 oz (73.7 kg)  Height: 4\' 9"  (1.448 m)  PainSc: 0-No pain   Body mass index is 35.14 kg/m.  Wt Readings from Last 3 Encounters:  03/16/23 162 lb 6.4 oz (73.7 kg)  04/28/22 164 lb (74.4 kg)  03/11/22 159 lb (72.1 kg)    The ASCVD Risk score (Arnett DK, et al., 2019) failed to calculate for the following reasons:   The 2019 ASCVD risk score is only valid for ages 28 to 41  Objective:  Physical Exam Vitals reviewed.  Constitutional:      General: She is not in acute distress.    Appearance: Normal appearance. She is obese.  HENT:     Mouth/Throat:     Comments: Mallopti 2 Cardiovascular:     Rate and Rhythm: Normal rate and regular rhythm.     Pulses: Normal pulses.     Heart sounds:  Normal heart sounds. No murmur heard. Pulmonary:     Effort: Pulmonary effort is normal. No respiratory distress.     Breath sounds: Normal breath sounds. No wheezing.  Neurological:     General: No focal deficit present.     Mental Status: She is alert and oriented to person, place, and time.     Cranial Nerves: No cranial nerve deficit.     Motor: No weakness.  Psychiatric:        Mood and Affect: Mood normal.        Behavior: Behavior normal.        Thought Content: Thought content normal.        Judgment: Judgment normal.         Assessment And Plan:  Snoring Assessment & Plan: Symptoms are worsening and having episodes of gasping. Mallopti 3  Orders: -     Ambulatory referral to Sleep Studies  Other fatigue Assessment & Plan: Will check for metabolic causes.   Orders: -     Vitamin B12 -     TSH -     Iron, TIBC and Ferritin  Panel -     CBC -     Ambulatory referral to Sleep Studies  Vitamin D deficiency Assessment & Plan: Will check vitamin d level due to the fatigue.   Orders: -     VITAMIN D 25 Hydroxy (Vit-D Deficiency, Fractures)  BMI 35.0-35.9,adult Assessment & Plan: She is encouraged to strive for BMI less than 30 to decrease cardiac risk. Advised to aim for at least 150 minutes of exercise per week.    COVID-19 vaccine dose declined Assessment & Plan: Declines covid 19 vaccine. Discussed risk of covid 56 and if she changes her mind about the vaccine to call the office. Education has been provided regarding the importance of this vaccine but patient still declined. Advised may receive this vaccine at local pharmacy or Health Dept.or vaccine clinic. Aware to provide a copy of the vaccination record if obtained from local pharmacy or Health Dept.  Encouraged to take multivitamin, vitamin d, vitamin c and zinc to increase immune system. Aware can call office if would like to have vaccine here at office. Verbalized acceptance and understanding.     Return if symptoms worsen or fail to improve.  Patient was given opportunity to ask questions. Patient verbalized understanding of the plan and was able to repeat key elements of the plan. All questions were answered to their satisfaction.   Carolyn Sparrow, FNP, have reviewed all documentation for this visit. The documentation on 03/16/23 for the exam, diagnosis, procedures, and orders are all accurate and complete.   IF YOU HAVE BEEN REFERRED TO A SPECIALIST, IT MAY TAKE 1-2 WEEKS TO SCHEDULE/PROCESS THE REFERRAL. IF YOU HAVE NOT HEARD FROM US/SPECIALIST IN TWO WEEKS, PLEASE GIVE [place] A CALL AT [phone].

## 2023-03-16 NOTE — Assessment & Plan Note (Signed)
Will check for metabolic causes. 

## 2023-03-16 NOTE — Assessment & Plan Note (Signed)
Will check vitamin d level due to the fatigue.

## 2023-03-16 NOTE — Assessment & Plan Note (Signed)
Declines covid 19 vaccine. Discussed risk of covid 19 and if she changes her mind about the vaccine to call the office. Education has been provided regarding the importance of this vaccine but patient still declined. Advised may receive this vaccine at local pharmacy or Health Dept.or vaccine clinic. Aware to provide a copy of the vaccination record if obtained from local pharmacy or Health Dept.  Encouraged to take multivitamin, vitamin d, vitamin c and zinc to increase immune system. Aware can call office if would like to have vaccine here at office. Verbalized acceptance and understanding.   

## 2023-03-16 NOTE — Assessment & Plan Note (Signed)
Symptoms are worsening and having episodes of gasping. Mallopti 3

## 2023-03-16 NOTE — Assessment & Plan Note (Signed)
She is encouraged to strive for BMI less than 30 to decrease cardiac risk. Advised to aim for at least 150 minutes of exercise per week.  

## 2023-03-17 LAB — IRON,TIBC AND FERRITIN PANEL
Ferritin: 36 ng/mL (ref 15–150)
Iron Saturation: 29 % (ref 15–55)
Iron: 84 ug/dL (ref 27–159)
Total Iron Binding Capacity: 290 ug/dL (ref 250–450)
UIBC: 206 ug/dL (ref 131–425)

## 2023-03-17 LAB — CBC
Hematocrit: 36.1 % (ref 34.0–46.6)
Hemoglobin: 12.4 g/dL (ref 11.1–15.9)
MCH: 32.6 pg (ref 26.6–33.0)
MCHC: 34.3 g/dL (ref 31.5–35.7)
MCV: 95 fL (ref 79–97)
Platelets: 258 10*3/uL (ref 150–450)
RBC: 3.8 x10E6/uL (ref 3.77–5.28)
RDW: 11.5 % — ABNORMAL LOW (ref 11.7–15.4)
WBC: 5.8 10*3/uL (ref 3.4–10.8)

## 2023-03-17 LAB — VITAMIN B12: Vitamin B-12: 538 pg/mL (ref 232–1245)

## 2023-03-17 LAB — TSH: TSH: 1.58 u[IU]/mL (ref 0.450–4.500)

## 2023-03-17 LAB — VITAMIN D 25 HYDROXY (VIT D DEFICIENCY, FRACTURES): Vit D, 25-Hydroxy: 13.4 ng/mL — ABNORMAL LOW (ref 30.0–100.0)

## 2023-03-23 ENCOUNTER — Encounter: Payer: No Typology Code available for payment source | Admitting: Nurse Practitioner

## 2023-03-23 DIAGNOSIS — M9903 Segmental and somatic dysfunction of lumbar region: Secondary | ICD-10-CM | POA: Diagnosis not present

## 2023-03-23 DIAGNOSIS — M9901 Segmental and somatic dysfunction of cervical region: Secondary | ICD-10-CM | POA: Diagnosis not present

## 2023-03-23 DIAGNOSIS — R293 Abnormal posture: Secondary | ICD-10-CM | POA: Diagnosis not present

## 2023-03-23 DIAGNOSIS — M62830 Muscle spasm of back: Secondary | ICD-10-CM | POA: Diagnosis not present

## 2023-03-25 ENCOUNTER — Encounter: Payer: Self-pay | Admitting: Nurse Practitioner

## 2023-03-28 ENCOUNTER — Other Ambulatory Visit: Payer: Self-pay | Admitting: Nurse Practitioner

## 2023-03-28 DIAGNOSIS — E559 Vitamin D deficiency, unspecified: Secondary | ICD-10-CM

## 2023-03-28 MED ORDER — VITAMIN D (ERGOCALCIFEROL) 1.25 MG (50000 UNIT) PO CAPS
50000.0000 [IU] | ORAL_CAPSULE | ORAL | 1 refills | Status: AC
Start: 2023-03-28 — End: ?
  Filled 2023-03-28: qty 24, 84d supply, fill #0
  Filled 2023-10-14: qty 24, 84d supply, fill #1

## 2023-03-29 ENCOUNTER — Other Ambulatory Visit: Payer: Self-pay

## 2023-03-30 ENCOUNTER — Other Ambulatory Visit: Payer: Self-pay

## 2023-03-30 DIAGNOSIS — F3342 Major depressive disorder, recurrent, in full remission: Secondary | ICD-10-CM | POA: Diagnosis not present

## 2023-03-30 DIAGNOSIS — F902 Attention-deficit hyperactivity disorder, combined type: Secondary | ICD-10-CM | POA: Diagnosis not present

## 2023-03-30 DIAGNOSIS — F429 Obsessive-compulsive disorder, unspecified: Secondary | ICD-10-CM | POA: Diagnosis not present

## 2023-03-30 MED ORDER — AMPHETAMINE-DEXTROAMPHETAMINE 10 MG PO TABS
10.0000 mg | ORAL_TABLET | Freq: Two times a day (BID) | ORAL | 0 refills | Status: DC | PRN
  Filled 2023-03-30: qty 60, 30d supply, fill #0

## 2023-04-06 DIAGNOSIS — M9903 Segmental and somatic dysfunction of lumbar region: Secondary | ICD-10-CM | POA: Diagnosis not present

## 2023-04-06 DIAGNOSIS — M62830 Muscle spasm of back: Secondary | ICD-10-CM | POA: Diagnosis not present

## 2023-04-06 DIAGNOSIS — R293 Abnormal posture: Secondary | ICD-10-CM | POA: Diagnosis not present

## 2023-04-06 DIAGNOSIS — M9901 Segmental and somatic dysfunction of cervical region: Secondary | ICD-10-CM | POA: Diagnosis not present

## 2023-04-15 ENCOUNTER — Encounter: Payer: No Typology Code available for payment source | Admitting: Nurse Practitioner

## 2023-04-29 ENCOUNTER — Encounter: Payer: Self-pay | Admitting: Nurse Practitioner

## 2023-04-29 ENCOUNTER — Ambulatory Visit: Payer: 59 | Admitting: Nurse Practitioner

## 2023-04-29 VITALS — BP 100/60 | HR 78 | Temp 98.4°F | Ht <= 58 in | Wt 156.0 lb

## 2023-04-29 DIAGNOSIS — Z0000 Encounter for general adult medical examination without abnormal findings: Secondary | ICD-10-CM

## 2023-04-29 DIAGNOSIS — E6609 Other obesity due to excess calories: Secondary | ICD-10-CM | POA: Insufficient documentation

## 2023-04-29 DIAGNOSIS — E7800 Pure hypercholesterolemia, unspecified: Secondary | ICD-10-CM

## 2023-04-29 DIAGNOSIS — K5909 Other constipation: Secondary | ICD-10-CM

## 2023-04-29 DIAGNOSIS — E559 Vitamin D deficiency, unspecified: Secondary | ICD-10-CM

## 2023-04-29 DIAGNOSIS — Z8669 Personal history of other diseases of the nervous system and sense organs: Secondary | ICD-10-CM | POA: Insufficient documentation

## 2023-04-29 DIAGNOSIS — Z6833 Body mass index (BMI) 33.0-33.9, adult: Secondary | ICD-10-CM

## 2023-04-29 NOTE — Patient Instructions (Signed)
[facility] Address: [address], [place], [place] [address] Phone: [phone]  Health Maintenance  Topic Date Due   COVID-19 Vaccine (5 - 2023-24 season) 05/14/2022   Flu Shot  04/14/2023   Pap Smear  07/26/2023   DTaP/Tdap/Td vaccine (2 - Td or Tdap) 03/06/2025   Hepatitis C Screening  Completed   HIV Screening  Completed   HPV Vaccine  Aged Out

## 2023-04-29 NOTE — Assessment & Plan Note (Signed)
Continue vitamin d, was lower at last visit. Get at least 15 minutes sunlight daily

## 2023-04-29 NOTE — Assessment & Plan Note (Addendum)
She is encouraged to strive for BMI less than 30 to decrease cardiac risk. Advised to aim for at least 150 minutes of exercise per week. Encouraged to at get in physical activity with walking

## 2023-04-29 NOTE — Progress Notes (Signed)
Carolyn Barnes, CMA,acting as a Neurosurgeon for Carolyn Felts, FNP.,have documented all relevant documentation on the behalf of Carolyn Felts, FNP,as directed by  Carolyn Felts, FNP while in the presence of Carolyn Felts, FNP.  Subjective:    Patient ID: Carolyn Barnes , female    DOB: 1985/12/05 , 37 y.o.   MRN: [id number]  Chief Complaint  Patient presents with   Annual Exam    HPI  Patient presents today for HM, patient reports complaince with medications. Patient denies any chest pain, SOB, or headaches. Patient has no concerns today. She is followed by Dr. Valentino Saxon GYN.   BP Readings from Last 3 Encounters: 04/29/23 : 100/60 03/16/23 : 120/64 04/28/22 : (!) 115/51       Past Medical History:  Diagnosis Date   Anxiety    Fatigue    GERD (gastroesophageal reflux disease)    History of hiatal hernia    Indigestion    Migraine headache    Seasonal allergies    Vitamin D deficiency disease      Family History  Problem Relation Age of Onset   Heart disease Maternal Grandmother    Diabetes Maternal Grandmother    Healthy Mother    Healthy Daughter    Breast cancer Neg Hx    Ovarian cancer Neg Hx    Colon cancer Neg Hx      Current Outpatient Medications:    amphetamine-dextroamphetamine (ADDERALL) 10 MG tablet, Take 1 tablet (10 mg total) by mouth 2 (two) times daily as needed for inattention, Disp: 60 tablet, Rfl: 0   fluticasone (FLONASE) 50 MCG/ACT nasal spray, Place 2 sprays into both nostrils daily., Disp: 16 g, Rfl: 0   Plecanatide (TRULANCE) 3 MG TABS, Take 1 tablet by mouth daily at 2 PM., Disp: 90 tablet, Rfl: 1   Ubrogepant (UBRELVY) 50 MG TABS, TAKE 1 TABLET BY MOUTH DAILY., Disp: 10 tablet, Rfl: 1   Vitamin D, Ergocalciferol, (DRISDOL) 1.25 MG (50000 UNIT) CAPS capsule, Take 1 capsule (50,000 Units total) by mouth 2 (two) times a week., Disp: 24 capsule, Rfl: 1   No Known Allergies    The patient states she uses vasectomy for birth control. Patient's last  menstrual period was 04/28/2023.  She is having heavy bleeding and has been irregular.  Negative for: breast discharge, breast lump(s), breast pain and breast self exam. Associated symptoms include abnormal vaginal bleeding. Pertinent negatives include anxiety, decreased libido, depression, difficulty falling sleep, dyspareunia, history of infertility, nocturia, sexual dysfunction, sleep disturbances, urinary incontinence, urinary urgency, vaginal discharge and vaginal itching. Diet regular. 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   The patient's tobacco use is:  Social History   Tobacco Use  Smoking Status Never  Smokeless Tobacco Never   She has been exposed to passive smoke. The patient's alcohol use is:  Social History   Substance and Sexual Activity  Alcohol Use No   Additional information: Last pap 2021, next one scheduled for 2024.    Review of Systems  Constitutional: Negative.   HENT: Negative.    Eyes: Negative.   Respiratory: Negative.    Cardiovascular: Negative.   Gastrointestinal: Negative.   Endocrine: Negative.   Genitourinary: Negative.   Musculoskeletal: Negative.   Skin: Negative.   Allergic/Immunologic: Negative.   Neurological: Negative.   Hematological: Negative.   Psychiatric/Behavioral: Negative.       Today's Vitals  04/29/23 0937  BP: 100/60  Pulse: 78  Temp: 98.4 F (36.9 C)  TempSrc: Oral  Weight: 156 lb (70.8 kg)  Height: 4\' 9"  (1.448 m)  PainSc: 0-No pain   Body mass index is 33.76 kg/m.  Wt Readings from Last 3 Encounters:  04/29/23 156 lb (70.8 kg)  03/16/23 162 lb 6.4 oz (73.7 kg)  04/28/22 164 lb (74.4 kg)     Objective:  Physical Exam Vitals reviewed.  Constitutional:      General: She is not in acute distress.    Appearance: Normal appearance. She is well-developed. She is obese.  HENT:     Head: Normocephalic and atraumatic.      Right Ear: Hearing, tympanic membrane, ear canal and external ear normal. There is no impacted cerumen.     Left Ear: Hearing, tympanic membrane, ear canal and external ear normal. There is no impacted cerumen.     Nose: Nose normal.     Mouth/Throat:     Mouth: Mucous membranes are moist.  Eyes:     General: Lids are normal.     Conjunctiva/sclera: Conjunctivae normal.     Pupils: Pupils are equal, round, and reactive to light.     Funduscopic exam:    Right eye: No papilledema.        Left eye: No papilledema.  Neck:     Thyroid: No thyroid mass.     Vascular: No carotid bruit.  Cardiovascular:     Rate and Rhythm: Normal rate and regular rhythm.     Pulses: Normal pulses.     Heart sounds: Normal heart sounds. No murmur heard. Pulmonary:     Effort: Pulmonary effort is normal. No respiratory distress.     Breath sounds: Normal breath sounds. No wheezing.  Chest:     Chest wall: No mass.  Breasts:    Tanner Score is 5.     Right: Normal.     Left: Normal.  Abdominal:     General: Abdomen is flat. Bowel sounds are normal. There is no distension.     Palpations: Abdomen is soft.     Tenderness: There is no abdominal tenderness.  Genitourinary:    Comments: Deferred - followed by GYN Musculoskeletal:        General: No swelling or tenderness. Normal range of motion.     Cervical back: Full passive range of motion without pain, normal range of motion and neck supple.     Right lower leg: No edema.     Left lower leg: No edema.  Lymphadenopathy:     Upper Body:     Right upper body: No supraclavicular, axillary or pectoral adenopathy.     Left upper body: No supraclavicular, axillary or pectoral adenopathy.  Skin:    General: Skin is warm and dry.     Capillary Refill: Capillary refill takes less than 2 seconds.  Neurological:     General: No focal deficit present.     Mental Status: She is alert and oriented to person, place, and time.     Cranial Nerves: No  cranial nerve deficit.     Sensory: No sensory deficit.     Motor: No weakness.  Psychiatric:        Mood and Affect: Mood normal.        Behavior: Behavior normal.        Thought Content: Thought content normal.        Judgment: Judgment normal.  Assessment And Plan:     Encounter for annual health examination Assessment & Plan: Behavior modifications discussed and diet history reviewed.   Pt will continue to exercise regularly and modify diet with low GI, plant based foods and decrease intake of processed foods.  Recommend intake of daily multivitamin, Vitamin D, and calcium.  Recommend self breast exams monthly for preventive screenings, as well as recommend immunizations that include influenza, TDAP    Class 1 obesity due to excess calories with body mass index (BMI) of 33.0 to 33.9 in adult, unspecified whether serious comorbidity present Assessment & Plan: She is encouraged to strive for BMI less than 30 to decrease cardiac risk. Advised to aim for at least 150 minutes of exercise per week. Encouraged to at get in physical activity with walking    History of migraine Assessment & Plan: No recent exacerbations of migraines  Orders: -     CMP14+EGFR  Vitamin D deficiency Assessment & Plan: Continue vitamin d, was lower at last visit. Get at least 15 minutes sunlight daily   Elevated LDL cholesterol level Assessment & Plan: Slightly elevated LDL, will recheck lipid panel.    Orders: -     Lipid panel  Chronic constipation Assessment & Plan: Continue Trulance and stay well hydrated.       Return for 1 year physical. Patient was given opportunity to ask questions. Patient verbalized understanding of the plan and was able to repeat key elements of the plan. All questions were answered to their satisfaction.   Carolyn Felts, FNP  I, Carolyn Felts, FNP, have reviewed all documentation for this visit. The documentation on 04/29/23 for the exam,  diagnosis, procedures, and orders are all accurate and complete.

## 2023-04-29 NOTE — Assessment & Plan Note (Signed)
No recent exacerbations of migraines

## 2023-04-29 NOTE — Assessment & Plan Note (Signed)
Slightly elevated LDL, will recheck lipid panel.

## 2023-04-29 NOTE — Assessment & Plan Note (Signed)
Behavior modifications discussed and diet history reviewed.   Pt will continue to exercise regularly and modify diet with low GI, plant based foods and decrease intake of processed foods.  Recommend intake of daily multivitamin, Vitamin D, and calcium.  Recommend self breast exams monthly for preventive screenings, as well as recommend immunizations that include influenza, TDAP  

## 2023-04-29 NOTE — Assessment & Plan Note (Signed)
Continue Trulance and stay well hydrated.

## 2023-05-11 ENCOUNTER — Other Ambulatory Visit: Payer: Self-pay

## 2023-05-11 DIAGNOSIS — F902 Attention-deficit hyperactivity disorder, combined type: Secondary | ICD-10-CM | POA: Diagnosis not present

## 2023-05-11 DIAGNOSIS — F429 Obsessive-compulsive disorder, unspecified: Secondary | ICD-10-CM | POA: Diagnosis not present

## 2023-05-11 DIAGNOSIS — F3342 Major depressive disorder, recurrent, in full remission: Secondary | ICD-10-CM | POA: Diagnosis not present

## 2023-05-11 MED ORDER — AMPHETAMINE-DEXTROAMPHETAMINE 10 MG PO TABS
10.0000 mg | ORAL_TABLET | Freq: Two times a day (BID) | ORAL | 0 refills | Status: DC
  Filled 2023-10-14: qty 60, 30d supply, fill #0

## 2023-05-11 MED ORDER — AMPHETAMINE-DEXTROAMPHETAMINE 10 MG PO TABS: 10.0000 mg | ORAL_TABLET | Freq: Two times a day (BID) | ORAL | 0 refills | Status: DC | PRN

## 2023-05-11 MED ORDER — AMPHETAMINE-DEXTROAMPHETAMINE 10 MG PO TABS
10.0000 mg | ORAL_TABLET | Freq: Two times a day (BID) | ORAL | 0 refills | Status: DC
  Filled 2023-05-11: qty 60, 30d supply, fill #0

## 2023-05-19 ENCOUNTER — Other Ambulatory Visit: Payer: Self-pay

## 2023-05-19 DIAGNOSIS — M25512 Pain in left shoulder: Secondary | ICD-10-CM | POA: Diagnosis not present

## 2023-05-19 DIAGNOSIS — M778 Other enthesopathies, not elsewhere classified: Secondary | ICD-10-CM | POA: Diagnosis not present

## 2023-05-19 DIAGNOSIS — M7542 Impingement syndrome of left shoulder: Secondary | ICD-10-CM | POA: Diagnosis not present

## 2023-05-19 MED ORDER — PREDNISONE 10 MG PO TABS
ORAL_TABLET | ORAL | 0 refills | Status: AC
  Filled 2023-05-19: qty 21, 6d supply, fill #0

## 2023-06-01 ENCOUNTER — Encounter: Payer: Self-pay | Admitting: Nurse Practitioner

## 2023-06-02 ENCOUNTER — Other Ambulatory Visit: Payer: Self-pay | Admitting: Nurse Practitioner

## 2023-06-02 DIAGNOSIS — R0683 Snoring: Secondary | ICD-10-CM

## 2023-07-19 ENCOUNTER — Other Ambulatory Visit: Payer: Self-pay

## 2023-07-19 ENCOUNTER — Ambulatory Visit (INDEPENDENT_AMBULATORY_CARE_PROVIDER_SITE_OTHER): Payer: 59 | Admitting: Obstetrics and Gynecology

## 2023-07-19 VITALS — BP 130/86 | HR 80 | Resp 16 | Ht <= 58 in | Wt 153.8 lb

## 2023-07-19 DIAGNOSIS — N946 Dysmenorrhea, unspecified: Secondary | ICD-10-CM | POA: Diagnosis not present

## 2023-07-19 DIAGNOSIS — N920 Excessive and frequent menstruation with regular cycle: Secondary | ICD-10-CM | POA: Diagnosis not present

## 2023-07-19 DIAGNOSIS — F3281 Premenstrual dysphoric disorder: Secondary | ICD-10-CM

## 2023-07-19 MED ORDER — NORETHINDRONE ACETATE 5 MG PO TABS
5.0000 mg | ORAL_TABLET | Freq: Every day | ORAL | 2 refills | Status: DC
  Filled 2023-07-19: qty 30, 30d supply, fill #0

## 2023-07-19 MED ORDER — SERTRALINE HCL 50 MG PO TABS
50.0000 mg | ORAL_TABLET | Freq: Every day | ORAL | 3 refills | Status: DC
  Filled 2023-07-19: qty 90, 90d supply, fill #0
  Filled 2023-10-14: qty 90, 90d supply, fill #1
  Filled 2024-03-09: qty 90, 90d supply, fill #2

## 2023-07-19 NOTE — Patient Instructions (Signed)
Supracervical Hysterectomy A supracervical hysterectomy, also called a subtotal hysterectomy, is a surgery to remove the top part of the uterus while leaving the lowest part of the uterus (cervix) in place. It may be done to treat these problems: Fibroid tumors. These are noncancerous growths in the uterus that cause symptoms. Endometriosis. This is when the tissue that lines the uterus is growing outside of its normal location. Certain types of uterine cancer. This surgery is usually done using a technique called laparoscopy, where a thin, lighted tube with a camera (laparoscope) is inserted into one of three or four small incisions made in the abdomen. The technique is minimally invasive, using small incisions instead of a large incision. The result is less pain, lower risk of infection, and shorter recovery time. Sometimes during this surgery, the fallopian tubes and ovaries may also be removed. After the surgery, menstrual periods will stop, and it will no longer be possible to get pregnant. Tell a health care provider about: Any allergies you have. All medicines you are taking, including vitamins, herbs, eye drops, creams, and over-the-counter medicines. Any problems you or family members have had with anesthetic medicines. Any blood disorders you have. Any surgeries you have had. Any medical conditions you have. Whether you are pregnant or may be pregnant. What are the risks? Generally, this is a safe procedure. However, problems may occur, including: Bleeding. Infection. Blood clots in the legs or lungs. Having to change from small incisions to open abdominal surgery. Additional surgery later to remove the cervix. Allergic reactions to medicines. Damage to other structures or organs. What happens before the procedure? Staying hydrated Follow instructions from your health care provider about hydration, which may include: Up to 2 hours before the procedure - you may continue to drink  clear liquids, such as water, clear fruit juice, black coffee, and plain tea.  Eating and drinking restrictions Follow instructions from your health care provider about eating and drinking, which may include: 8 hours before the procedure - stop eating heavy meals or foods, such as meat, fried foods, or fatty foods. 6 hours before the procedure - stop eating light meals or foods, such as toast or cereal. 6 hours before the procedure - stop drinking milk or drinks that contain milk. 2 hours before the procedure - stop drinking clear liquids. Medicines Take over-the-counter and prescription medicines only as told by your health care provider. You may be asked to take a medicine to empty your colon (bowel preparation). General instructions Do not use any products that contain nicotine or tobacco for at least 4 weeks before the procedure. These products include cigarettes, chewing tobacco, and vaping devices, such as e-cigarettes. If you need help quitting, ask your health care provider. Do not drink beverages that contain alcohol prior to the procedure. Alcohol can increase your risk of bleeding and complications. If you need help stopping, ask your health care provider. This procedure can affect the way you feel about yourself. Talk with your health care provider about the physical and emotional changes that a hysterectomy may cause. Plan to have a responsible adult take you home from the hospital or clinic. Plan to have a responsible adult care for you for the time you are told after you leave the hospital or clinic. This is important. Ask your health care provider: How your surgery site will be marked. What steps will be taken to help prevent infection. These steps may include: Removing hair at the surgery site. Washing skin with a germ-killing  soap. Taking antibiotic medicine. What happens during the procedure? An IV will be inserted into one of your veins. You will be given one or more of  the following: A medicine to help you relax (sedative). A medicine to make you fall asleep (general anesthetic). A medicine that is injected into your spine to numb the area below and slightly above the injection site (spinal anesthetic). A medicine that is injected into an area of your body to numb everything below the injection site (regional anesthetic). Tight fitting (compression) stockings will be placed on your legs to promote circulation. A gas will be used to inflate your abdomen. This will allow your surgeon to look inside your abdomen and perform the surgery. Three or four small incisions will be made in your abdomen. A laparoscope will be inserted into one of your incisions. The camera on the laparoscope will send pictures to a TV screen in the operating room, making it easy for the surgeon to have a good view of your abdomen. Surgical instruments will be inserted through the other incisions. The uterus will be cut into small pieces and removed through these incisions. Your incisions will be closed with stitches (sutures), skin glue, or adhesive strips. The procedure may vary among health care providers and hospitals. What happens after the procedure? Your blood pressure, heart rate, breathing rate, and blood oxygen level will be monitored until you leave the hospital or clinic. You may have some discomfort, tenderness, swelling, and bruising at the surgery site. You will be given pain medicine for use in the hospital and at home after you are discharged. You will be encouraged to walk as soon as possible. You will also use a device or do breathing exercises to keep your lungs clear. You may need to use a sanitary napkin for discharge from the vagina. Summary A supracervical hysterectomy is a surgery to remove the top part of the uterus while leaving the lowest part of the uterus (cervix) in place. This surgery is usually done using a minimally invasive technique, which means it is  done through small incisions instead of a large incision. The minimally invasive technique results in less pain, lower risk of infection, and shorter recovery time. Plan to have a responsible adult take you home from the hospital or clinic. This information is not intended to replace advice given to you by your health care provider. Make sure you discuss any questions you have with your health care provider. Document Revised: 11/11/2021 Document Reviewed: 05/12/2020 Elsevier Patient Education  2024 [employer].

## 2023-07-19 NOTE — Progress Notes (Unsigned)
    GYNECOLOGY PROGRESS NOTE  Subjective:    Patient ID: Frankey Shown, female    DOB: 12-15-1985, 37 y.o.   MRN: [id number]  HPI  Patient is a 37 y.o. [id number] female who presents for mood check. She has a history of PMDD, also with menorrhagia.  Has been on Zoloft in the past to manage her mood symptoms in the past, but feels like she needs to resume.  Notes symptoms are throughout the month now, may also be related to stress from school and work.   She also reports a change in her menstrual cycles. They are typically heavy, but now notes that her cycles are shortening (used to be 28-30 days, now 25 days), usually lasting 5-6 days.  Reports passing a lot of clots. Also cycles are quite painful. Has attempted to utilize an IUD in the past which made your symptoms worse. Cannot utilize estrogen containing methods due to history of migraines. Notes she thinks she is ready for definitive management.    The following portions of the patient's history were reviewed and updated as appropriate: allergies, current medications, past family history, past medical history, past social history, past surgical history, and problem list.  Review of Systems Pertinent items noted in HPI and remainder of comprehensive ROS otherwise negative.   Objective:   Resp. rate 16, height 4\' 9"  (1.448 m), weight 153 lb 12.8 oz (69.8 kg), last menstrual period 07/17/2023.  Body mass index is 33.28 kg/m.  General appearance: alert and no distress Remainder of exam deferred.    Assessment:   1. PMDD (premenstrual dysphoric disorder)   2. Menorrhagia with regular cycle   3. Dysmenorrhea      Plan:   1. PMDD (premenstrual dysphoric disorder) - Discussed resumption of Zoloft for symptoms. Will prescribe 50 mg daily.   2. Menorrhagia with regular cycle - US PELVIC COMPLETE WITH TRANSVAGINAL; Future - Advised on options for management, including progesterone-only methods, Lysteda, or surgical management with  hysterectomy. Patient notes she would like a hysterectomy. Discussed different types of hysterectomy.  Thinks she would like a supracervical hysterectomy. Information included info in after visit summary. Will return for preop in 4-5 weeks.   - Patient notes she is interested in progesterone-only method Aygestin. Will prescribe.   3. Dysmenorrhea - US PELVIC COMPLETE WITH TRANSVAGINAL; Future   - Encourage NSAIDs.    A total of 28 minutes were spent face-to-face with the patient during this encounter and over half of that time involved counseling and coordination of care.   Hildred Laser, MD [facility]

## 2023-07-20 ENCOUNTER — Other Ambulatory Visit: Payer: Self-pay

## 2023-07-20 ENCOUNTER — Encounter: Payer: Self-pay | Admitting: Obstetrics and Gynecology

## 2023-08-01 DIAGNOSIS — F3342 Major depressive disorder, recurrent, in full remission: Secondary | ICD-10-CM | POA: Diagnosis not present

## 2023-08-01 DIAGNOSIS — F902 Attention-deficit hyperactivity disorder, combined type: Secondary | ICD-10-CM | POA: Diagnosis not present

## 2023-08-01 DIAGNOSIS — F429 Obsessive-compulsive disorder, unspecified: Secondary | ICD-10-CM | POA: Diagnosis not present

## 2023-08-18 ENCOUNTER — Encounter: Payer: Self-pay | Admitting: Obstetrics and Gynecology

## 2023-08-18 ENCOUNTER — Other Ambulatory Visit: Payer: Self-pay | Admitting: Obstetrics and Gynecology

## 2023-08-18 ENCOUNTER — Other Ambulatory Visit: Payer: Self-pay

## 2023-08-18 ENCOUNTER — Ambulatory Visit
Admission: RE | Admit: 2023-08-18 | Discharge: 2023-08-18 | Disposition: A | Discharge status: Home / Self Care | Payer: 59 | Source: Ambulatory Visit | Attending: Obstetrics and Gynecology | Admitting: Obstetrics and Gynecology

## 2023-08-18 DIAGNOSIS — N920 Excessive and frequent menstruation with regular cycle: Secondary | ICD-10-CM | POA: Insufficient documentation

## 2023-08-18 DIAGNOSIS — N946 Dysmenorrhea, unspecified: Secondary | ICD-10-CM | POA: Diagnosis not present

## 2023-08-18 MED ORDER — NORETHINDRONE ACETATE 5 MG PO TABS
5.0000 mg | ORAL_TABLET | Freq: Every day | ORAL | 3 refills | Status: DC
  Filled 2023-08-18: qty 90, 90d supply, fill #0

## 2023-08-18 NOTE — Telephone Encounter (Signed)
Please cancel patient's surgery that was scheduled for 12/20

## 2023-08-19 ENCOUNTER — Other Ambulatory Visit: Payer: 59

## 2023-08-23 ENCOUNTER — Encounter: Payer: 59 | Admitting: Obstetrics and Gynecology

## 2023-08-26 ENCOUNTER — Other Ambulatory Visit: Payer: 59

## 2023-08-29 ENCOUNTER — Other Ambulatory Visit: Payer: Self-pay

## 2023-09-02 ENCOUNTER — Ambulatory Visit: Admission: RE | Admit: 2023-09-02 | Payer: 59 | Source: Home / Self Care | Admitting: Obstetrics and Gynecology

## 2023-09-02 ENCOUNTER — Encounter: Admission: RE | Payer: Self-pay | Source: Home / Self Care

## 2023-09-02 SURGERY — HYSTERECTOMY, SUPRACERVICAL, LAPAROSCOPIC
Anesthesia: General

## 2023-09-13 ENCOUNTER — Encounter: Payer: 59 | Admitting: Obstetrics and Gynecology

## 2023-09-15 ENCOUNTER — Encounter: Payer: 59 | Admitting: Obstetrics and Gynecology

## 2023-10-14 ENCOUNTER — Other Ambulatory Visit: Payer: Self-pay

## 2024-01-30 ENCOUNTER — Ambulatory Visit: Admitting: Family Medicine

## 2024-01-30 VITALS — BP 125/88 | HR 84 | Ht <= 58 in | Wt 160.0 lb

## 2024-01-30 DIAGNOSIS — E559 Vitamin D deficiency, unspecified: Secondary | ICD-10-CM | POA: Diagnosis not present

## 2024-01-30 DIAGNOSIS — F3281 Premenstrual dysphoric disorder: Secondary | ICD-10-CM | POA: Diagnosis not present

## 2024-01-30 DIAGNOSIS — K581 Irritable bowel syndrome with constipation: Secondary | ICD-10-CM | POA: Diagnosis not present

## 2024-01-30 DIAGNOSIS — E7800 Pure hypercholesterolemia, unspecified: Secondary | ICD-10-CM | POA: Diagnosis not present

## 2024-01-30 DIAGNOSIS — G43009 Migraine without aura, not intractable, without status migrainosus: Secondary | ICD-10-CM | POA: Diagnosis not present

## 2024-01-30 DIAGNOSIS — F900 Attention-deficit hyperactivity disorder, predominantly inattentive type: Secondary | ICD-10-CM | POA: Diagnosis not present

## 2024-01-30 DIAGNOSIS — J3089 Other allergic rhinitis: Secondary | ICD-10-CM | POA: Diagnosis not present

## 2024-01-30 DIAGNOSIS — N644 Mastodynia: Secondary | ICD-10-CM

## 2024-01-30 LAB — POCT URINE PREGNANCY: Preg Test, Ur: NEGATIVE

## 2024-01-30 NOTE — Assessment & Plan Note (Signed)
 Elevated LDL cholesterol Elevated LDL cholesterol. -will check lipid panel at CPE in 04/2024

## 2024-01-30 NOTE — Assessment & Plan Note (Signed)
 Chronic IBS  Continue Trulance  3mg  daily

## 2024-01-30 NOTE — Assessment & Plan Note (Signed)
 ADHD Chronic  Stable  Previously diagnosed with ADHD. Adderall and other medications trialed but not tolerated due to sleep disturbances. Currently not on ADHD medication. Reports challenges with focus but has decided not to pursue further medication trials at this time. - continue to follow up with psychiatry

## 2024-01-30 NOTE — Progress Notes (Signed)
 New patient visit   Patient: Carolyn Barnes   DOB: Dec 26, 1985   38 y.o. Female  MRN: [id number] Visit Date: 01/30/2024  Today's healthcare provider: Mimi Alt, MD   Chief Complaint  Patient presents with   Establish Care    R breast pain for about 2 months,    Subjective    [place] is a 38 y.o. female who presents today as a new patient to establish care.   HPI     Establish Care    Additional comments: R breast pain for about 2 months,       Last edited by Bart Lieu, CMA on 01/30/2024  3:45 PM.       Discussed the use of AI scribe software for clinical note transcription with the patient, who gave verbal consent to proceed.  History of Present Illness [place] is a 37 year old female who presents with right breast pain.  She has been experiencing right breast pain for the past two months, described as aching and significant enough to wake her at night. The pain is located at the eleven o'clock position and does not radiate. No nodules, lumps, or changes in the skin such as redness or bruising. No history of trauma to the area, and no nipple discharge. The pain has since subsided, but she found it unusual due to its intensity and duration. No new supplements or increased caffeine intake during the time of breast pain.  Her medical history includes migraine headaches managed with Ubrelvy  50 mg as needed, which she finds effective. She has a history of ADHD and has tried various medications including Adderall, Vyvanse, and Strattera, but discontinued them due to side effects and lack of efficacy. She is not currently on any ADHD medication. She also has PMDD and takes Zoloft  50 mg during the luteal phase to manage symptoms. Chronic constipation is managed with Trulance  3 mg as needed, a couple of times a week. She was diagnosed with IBS, predominantly constipation type.  Her past medical history also includes obesity with a BMI of  34.62, chronic knee pain, allergic rhinitis for which she uses Flonase , vitamin D  deficiency treated with 50,000 units twice weekly, and elevated LDL cholesterol. Her last vitamin D  level was 13.4 in July 2024.  Family history is significant for a great maternal aunt with breast cancer. She started menstruating at the age of 85. She has no personal history of breast biopsies or other breast issues.  She has lived in the area for a long time and prefers to see a provider who looks like her. 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   Past Medical History:  Diagnosis Date   Anxiety    Fatigue    GERD (gastroesophageal reflux disease)    History of hiatal hernia    Indigestion    Migraine headache    Seasonal allergies    Vitamin D  deficiency disease     Outpatient Medications Prior to Visit  Medication Sig   fluticasone  (FLONASE ) 50 MCG/ACT nasal spray Place 2 sprays into both nostrils daily.   Plecanatide  (TRULANCE ) 3 MG TABS Take 1 tablet by mouth daily at 2 PM.   sertraline  (ZOLOFT ) 50 MG tablet Take 1 tablet (50 mg total) by mouth daily.   Ubrogepant  (UBRELVY ) 50 MG TABS TAKE 1 TABLET BY MOUTH DAILY.   Vitamin D , Ergocalciferol , (DRISDOL ) 1.25 MG (50000 UNIT) CAPS capsule Take 1 capsule (50,000 Units  total) by mouth 2 (two) times a week.   [DISCONTINUED] amphetamine -dextroamphetamine  (ADDERALL) 10 MG tablet Take 1 tablet (10 mg total) by mouth 2 (two) times daily as needed for inattention (Patient not taking: Reported on 01/30/2024)   [DISCONTINUED] amphetamine -dextroamphetamine  (ADDERALL) 10 MG tablet Take 1 tablet (10 mg total) by mouth 2 (two) times daily as needed for inattention. (Patient not taking: Reported on 01/30/2024)   [DISCONTINUED] amphetamine -dextroamphetamine  (ADDERALL) 10 MG tablet Take 1 tablet (10 mg total) by mouth 2 (two) times daily as needed for inattention. (Patient not taking: Reported on 01/30/2024)   [DISCONTINUED]  amphetamine -dextroamphetamine  (ADDERALL) 10 MG tablet Take 1 tablet (10 mg total) by mouth 2 (two) times daily as needed for inattention (Patient not taking: Reported on 01/30/2024)   [DISCONTINUED] norethindrone  (AYGESTIN ) 5 MG tablet Take 1 tablet (5 mg total) by mouth daily. (Patient not taking: Reported on 01/30/2024)   No facility-administered medications prior to visit.    Past Surgical History:  Procedure Laterality Date   DILATION AND CURETTAGE OF UTERUS  02/2013,10/2013   ESOPHAGOGASTRODUODENOSCOPY  02/2014   Dr Ole Berkeley - small HH and gastritis   XI ROBOTIC ASSISTED VENTRAL HERNIA N/A 09/04/2019   Procedure: XI ROBOTIC ASSISTED VENTRAL HERNIA;  Surgeon: Alben Alma, MD;  Location: [facility] ORS;  Service: General;  Laterality: N/A;   Family Status  Relation Name Status   Mother  Alive   Father  Alive   Daughter  (Not Specified)   MGM  (Not Specified)   Neg Hx  (Not Specified)  No partnership data on file   Family History  Problem Relation Age of Onset   Healthy Mother    Healthy Daughter    Heart disease Maternal Grandmother    Diabetes Maternal Grandmother    Breast cancer Neg Hx    Ovarian cancer Neg Hx    Colon cancer Neg Hx    Social History   Socioeconomic History   Marital status: Married    Spouse name: Not on file   Number of children: 2   Years of education: Not on file   Highest education level: Associate degree: academic program  Occupational History   Not on file  Tobacco Use   Smoking status: Never   Smokeless tobacco: Never  Vaping Use   Vaping status: Never Used  Substance and Sexual Activity   Alcohol use: No   Drug use: No   Sexual activity: Yes    Birth control/protection: None, Other-see comments    Comment: Husband-vasectomy  Other Topics Concern   Not on file  Social History Narrative   Not on file   Social Drivers of Health   Financial Resource Strain: Low Risk  (01/30/2024)   Overall Financial Resource Strain (CARDIA)    Difficulty  of Paying Living Expenses: Not hard at all  Food Insecurity: No Food Insecurity (01/30/2024)   Hunger Vital Sign    Worried About Running Out of Food in the Last Year: Never true    Ran Out of Food in the Last Year: Never true  Transportation Needs: No Transportation Needs (01/30/2024)   PRAPARE - Administrator, Civil Service (Medical): No    Lack of Transportation (Non-Medical): No  Physical Activity: Sufficiently Active (01/30/2024)   Exercise Vital Sign    Days of Exercise per Week: 5 days    Minutes of Exercise per Session: 30 min  Stress: No Stress Concern Present (01/30/2024)   [employer] of Occupational Health - Occupational Stress Questionnaire  Feeling of Stress : Only a little  Social Connections: Socially Integrated (01/30/2024)   Social Connection and Isolation Panel [NHANES]    Frequency of Communication with Friends and Family: More than three times a week    Frequency of Social Gatherings with Friends and Family: Once a week    Attends Religious Services: More than 4 times per year    Active Member of Clubs or Organizations: Yes    Attends Engineer, structural: More than 4 times per year    Marital Status: Married     No Known Allergies  Immunization History  Administered Date(s) Administered   Influenza,inj,Quad PF,6+ Mos 05/23/2015   Influenza-Unspecified 06/11/2018, 06/14/2019, 07/03/2019, 07/10/2020, 07/07/2021   PFIZER(Purple Top)SARS-COV-2 Vaccination 11/09/2019, 11/30/2019, 08/01/2020   PPD Test 03/19/2013   Pfizer Covid-19 Vaccine Bivalent Booster 33yrs & up 08/28/2021   Tdap 03/07/2015    Health Maintenance  Topic Date Due   COVID-19 Vaccine (5 - 2024-25 season) 05/15/2023   INFLUENZA VACCINE  04/13/2024   DTaP/Tdap/Td (2 - Td or Tdap) 03/06/2025   Cervical Cancer Screening (HPV/Pap Cotest)  07/25/2025   Hepatitis C Screening  Completed   HIV Screening  Completed   HPV VACCINES  Aged Out   Meningococcal B Vaccine  Aged  Out    Patient Care Team: Mimi Alt, MD as PCP - General (Family Medicine) Sheron Dixons, MD as PCP - Internal Medicine (Internal Medicine) Teresa Fender, MD (Inactive) as Referring Physician (Obstetrics and Gynecology) Marnee Sink, MD as Consulting Physician (Gastroenterology)  Review of Systems  Last CBC Lab Results  Component Value Date   WBC 5.8 03/16/2023   HGB 12.4 03/16/2023   HCT 36.1 03/16/2023   MCV 95 03/16/2023   MCH 32.6 03/16/2023   RDW 11.5 (L) 03/16/2023   PLT 258 03/16/2023   Last metabolic panel Lab Results  Component Value Date   GLUCOSE 82 03/11/2022   NA 138 03/11/2022   K 4.3 03/11/2022   CL 104 03/11/2022   CO2 20 03/11/2022   BUN 9 03/11/2022   CREATININE 0.89 03/11/2022   EGFR 87 03/11/2022   CALCIUM 9.3 03/11/2022   PROT 7.5 03/11/2022   ALBUMIN 4.5 03/11/2022   LABGLOB 3.0 03/11/2022   AGRATIO 1.5 03/11/2022   BILITOT 0.3 03/11/2022   ALKPHOS 48 03/11/2022   AST 14 03/11/2022   ALT 19 03/11/2022   Last lipids Lab Results  Component Value Date   CHOL 200 (H) 03/11/2022   HDL 70 03/11/2022   LDLCALC 117 (H) 03/11/2022   TRIG 74 03/11/2022   CHOLHDL 2.9 03/11/2022   Last hemoglobin A1c Lab Results  Component Value Date   HGBA1C 4.9 03/07/2020   Last thyroid  functions Lab Results  Component Value Date   TSH 1.580 03/16/2023   Last vitamin D  Lab Results  Component Value Date   VD25OH 13.4 (L) 03/16/2023   Last vitamin B12 and Folate Lab Results  Component Value Date   VITAMINB12 538 03/16/2023        Objective    BP 125/88   Pulse 84   Ht 4\' 9"  (1.448 m)   Wt 160 lb (72.6 kg)   SpO2 100%   BMI 34.62 kg/m  BP Readings from Last 3 Encounters:  01/30/24 125/88  07/19/23 130/86  04/29/23 100/60   Wt Readings from Last 3 Encounters:  01/30/24 160 lb (72.6 kg)  07/19/23 153 lb 12.8 oz (69.8 kg)  04/29/23 156 lb (70.8 kg)  Depression Screen    01/30/2024    3:48 PM 07/19/2023     4:03 PM 04/29/2023    9:37 AM 03/16/2023    4:33 PM  PHQ 2/9 Scores  PHQ - 2 Score 1 2 0 0  PHQ- 9 Score 4 13 0 0   Results for orders placed or performed in visit on 01/30/24  POCT urine pregnancy  Result Value Ref Range   Preg Test, Ur Negative Negative     Physical Exam Physical Exam VITALS: BP- 125/88 MEASUREMENTS: BMI- 34.62. NECK: No thyromegaly, no thyroid  nodules palpated. CHEST: Lungs clear to auscultation bilaterally. CARDIOVASCULAR: Regular rate and rhythm, no murmurs. BREAST: Breasts non-tender.   Assessment & Plan      Problem List Items Addressed This Visit       Cardiovascular and Mediastinum   Headache, migraine   Migraine Chronic  Stable  Migraine headaches managed with Ubrelvy  50 mg, which is effective. Prescription managed by previous primary care provider. - continue ubrelvy  50mg  daily         Respiratory   Allergic rhinitis - Primary     Digestive   Chronic constipation   Chronic IBS  Continue Trulance  3mg  daily        Other   Vitamin D  deficiency   Vitamin D  deficiency, chronic Vitamin D  deficiency with last level at 13.4 in July 2024. Currently on high-dose vitamin D  supplementation twice weekly. Next level check scheduled for annual physical in August.      PMDD (premenstrual dysphoric disorder)   Premenstrual dysphoric disorder (PMDD) Chronic  Stable on regimen  PMDD managed with Zoloft  50 mg during the luteal phase. - continue Zoloft  50mg  during luteal phase       Elevated LDL cholesterol level   Elevated LDL cholesterol Elevated LDL cholesterol. -will check lipid panel at CPE in 04/2024      ADHD (attention deficit hyperactivity disorder)   ADHD Chronic  Stable  Previously diagnosed with ADHD. Adderall and other medications trialed but not tolerated due to sleep disturbances. Currently not on ADHD medication. Reports challenges with focus but has decided not to pursue further medication trials at this time. -  continue to follow up with psychiatry      Other Visit Diagnoses       Breast pain, right       Relevant Orders   MM 3D SCREENING MAMMOGRAM BILATERAL BREAST   POCT urine pregnancy (Completed)   US  LIMITED ULTRASOUND INCLUDING AXILLA LEFT BREAST    US  LIMITED ULTRASOUND INCLUDING AXILLA RIGHT BREAST       Assessment & Plan Right breast pain Right breast pain for two months, primarily at the 11 o'clock position, with fluctuating severity. No nodules, redness, or trauma. No family history of breast cancer except for one great maternal aunt. No previous mammograms or breast biopsies. Differential includes hormonal changes or benign breast conditions. - Order mammogram and breast ultrasound   Obesity BMI of 34.62, indicating obesity. -recommend well balanced diet and 150 mins of exercise per week    Allergic rhinitis Allergic rhinitis managed with Flonase .      Return in about 3 months (around 05/01/2024) for CPE.      Mimi Alt, MD  [facility] [phone] (phone) [phone] (fax)  [facility]

## 2024-01-30 NOTE — Assessment & Plan Note (Signed)
 Premenstrual dysphoric disorder (PMDD) Chronic  Stable on regimen  PMDD managed with Zoloft  50 mg during the luteal phase. - continue Zoloft  50mg  during luteal phase

## 2024-01-30 NOTE — Assessment & Plan Note (Signed)
 Migraine Chronic  Stable  Migraine headaches managed with Ubrelvy  50 mg, which is effective. Prescription managed by previous primary care provider. - continue ubrelvy  50mg  daily

## 2024-01-30 NOTE — Assessment & Plan Note (Signed)
 Vitamin D  deficiency, chronic Vitamin D  deficiency with last level at 13.4 in July 2024. Currently on high-dose vitamin D  supplementation twice weekly. Next level check scheduled for annual physical in August.

## 2024-02-14 ENCOUNTER — Ambulatory Visit
Admission: RE | Admit: 2024-02-14 | Discharge: 2024-02-14 | Disposition: A | Discharge status: Home / Self Care | Source: Ambulatory Visit | Attending: Family Medicine | Admitting: Family Medicine

## 2024-02-14 DIAGNOSIS — N644 Mastodynia: Secondary | ICD-10-CM

## 2024-02-14 DIAGNOSIS — R92333 Mammographic heterogeneous density, bilateral breasts: Secondary | ICD-10-CM | POA: Diagnosis not present

## 2024-04-25 ENCOUNTER — Other Ambulatory Visit: Payer: Self-pay

## 2024-04-25 ENCOUNTER — Other Ambulatory Visit: Payer: Self-pay | Admitting: Physician Assistant

## 2024-04-25 DIAGNOSIS — B3731 Acute candidiasis of vulva and vagina: Secondary | ICD-10-CM

## 2024-04-25 MED ORDER — FLUCONAZOLE 150 MG PO TABS
150.0000 mg | ORAL_TABLET | Freq: Once | ORAL | 0 refills | Status: AC
  Filled 2024-04-25 (×2): qty 1, 1d supply, fill #0

## 2024-04-25 NOTE — Progress Notes (Signed)
 Diflucan  150mg  x 1 dose for VVC.

## 2024-05-01 ENCOUNTER — Ambulatory Visit (INDEPENDENT_AMBULATORY_CARE_PROVIDER_SITE_OTHER): Admitting: Family Medicine

## 2024-05-01 ENCOUNTER — Encounter: Payer: Self-pay | Admitting: Family Medicine

## 2024-05-01 VITALS — BP 94/37 | HR 73 | Resp 16 | Ht <= 58 in | Wt 154.4 lb

## 2024-05-01 DIAGNOSIS — Z130 Encounter for screening for diseases of the blood and blood-forming organs and certain disorders involving the immune mechanism: Secondary | ICD-10-CM

## 2024-05-01 DIAGNOSIS — F900 Attention-deficit hyperactivity disorder, predominantly inattentive type: Secondary | ICD-10-CM | POA: Diagnosis not present

## 2024-05-01 DIAGNOSIS — E559 Vitamin D deficiency, unspecified: Secondary | ICD-10-CM | POA: Diagnosis not present

## 2024-05-01 DIAGNOSIS — E7800 Pure hypercholesterolemia, unspecified: Secondary | ICD-10-CM | POA: Diagnosis not present

## 2024-05-01 DIAGNOSIS — Z6833 Body mass index (BMI) 33.0-33.9, adult: Secondary | ICD-10-CM | POA: Diagnosis not present

## 2024-05-01 DIAGNOSIS — E66811 Obesity, class 1: Secondary | ICD-10-CM | POA: Diagnosis not present

## 2024-05-01 DIAGNOSIS — Z131 Encounter for screening for diabetes mellitus: Secondary | ICD-10-CM

## 2024-05-01 DIAGNOSIS — Z0000 Encounter for general adult medical examination without abnormal findings: Secondary | ICD-10-CM | POA: Insufficient documentation

## 2024-05-01 DIAGNOSIS — E6609 Other obesity due to excess calories: Secondary | ICD-10-CM

## 2024-05-01 DIAGNOSIS — K5909 Other constipation: Secondary | ICD-10-CM

## 2024-05-01 NOTE — Assessment & Plan Note (Addendum)
 Adult Wellness Visit Annual physical examination conducted. BMI is 33.41, indicating class 1 obesity. Current physical activity includes 7,000-8,000 steps per day, with a goal to increase to 10,000 steps. Diet includes fruits and vegetables but high fast food consumption due to busy schedule. - Encouraged 150 minutes of exercise per week and a well-balanced diet. - Plans to incorporate meal preparation and optimize lunch breaks for walking. - Recommended seasonal flu vaccine, HPV vaccine, and COVID vaccine. - HPV vaccine discussed as a preventive measure against cervical, head and neck, and genital cancers, with a three-dose schedule. - Informed about the extension of HPV vaccine guidelines to age 50 and offered information to consider vaccination. - Order CBC to screen for anemia. - Order A1c to screen for diabetes. - Order CMP to assess liver, kidney function, and electrolytes. - Order lipid panel to assess cholesterol levels. - Order vitamin D  levels. - last pap completed by GYN in 2021, pt will follow up with GYN

## 2024-05-01 NOTE — Assessment & Plan Note (Signed)
 Vitamin D  deficiency. - Order vitamin D  levels.

## 2024-05-01 NOTE — Progress Notes (Signed)
 Complete physical exam   Patient: Carolyn Barnes   DOB: [birth year]   38 y.o. Female  MRN: [id number] Visit Date: 05/01/2024  Today's healthcare provider: Rockie Agent, MD   Chief Complaint  Patient presents with   Annual Exam    CPE    Subjective    Carolyn Barnes is a 38 y.o. female who presents today for a complete physical exam.    She does not have additional problems to discuss today.   Discussed the use of AI scribe software for clinical note transcription with the patient, who gave verbal consent to proceed.  History of Present Illness [place] is a 38 year old female who presents for an annual physical exam.  She has a BMI of 33.41. Her busy schedule with work and school affects her ability to engage in regular exercise. She averages 7,000 to 8,000 steps per day and aims to increase this to 10,000 steps. She consumes fast food at least three times a week due to her busy schedule and acknowledges the need to improve her physical activity and diet.  She has a history of vitamin D  deficiency and elevated LDL cholesterol. She is not currently on any specific medication for these conditions. She consumes fruits and vegetables but has a higher intake of fast food since starting school.  She has ADHD.  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 Her busy lifestyle impacts her diet and exercise routine.  Her blood pressure was 94/37, which she reports is lower than her usual readings.     Past Medical History:  Diagnosis Date   Anxiety    Fatigue    GERD (gastroesophageal reflux disease)    History of hiatal hernia    Indigestion    Migraine headache    Seasonal allergies    Vitamin D  deficiency disease    Past Surgical History:  Procedure Laterality Date   DILATION AND CURETTAGE OF UTERUS  02/2013,10/2013   ESOPHAGOGASTRODUODENOSCOPY  02/2014   Dr Jinny - small HH and gastritis   XI ROBOTIC  ASSISTED VENTRAL HERNIA N/A 09/04/2019   Procedure: XI ROBOTIC ASSISTED VENTRAL HERNIA;  Surgeon: Jordis Laneta FALCON, MD;  Location: [facility] ORS;  Service: General;  Laterality: N/A;   Social History   Socioeconomic History   Marital status: Married    Spouse name: Not on file   Number of children: 2   Years of education: Not on file   Highest education level: Associate degree: academic program  Occupational History   Not on file  Tobacco Use   Smoking status: Never   Smokeless tobacco: Never  Vaping Use   Vaping status: Never Used  Substance and Sexual Activity   Alcohol use: No   Drug use: No   Sexual activity: Yes    Birth control/protection: None, Other-see comments    Comment: Husband-vasectomy  Other Topics Concern   Not on file  Social History Narrative   Not on file   Social Drivers of Health   Financial Resource Strain: Low Risk  (04/30/2024)   Overall Financial Resource Strain (CARDIA)    Difficulty of Paying Living Expenses: Not hard at all  Food Insecurity: No Food Insecurity (04/30/2024)   Hunger Vital Sign    Worried About Running Out of Food in the Last Year: Never true    Ran Out of Food in the Last Year: Never true  Transportation Needs: No Transportation Needs (04/30/2024)  PRAPARE - Administrator, Civil Service (Medical): No    Lack of Transportation (Non-Medical): No  Physical Activity: Sufficiently Active (04/30/2024)   Exercise Vital Sign    Days of Exercise per Week: 5 days    Minutes of Exercise per Session: 60 min  Stress: Stress Concern Present (04/30/2024)   [employer] of Occupational Health - Occupational Stress Questionnaire    Feeling of Stress: To some extent  Social Connections: Socially Integrated (04/30/2024)   Social Connection and Isolation Panel    Frequency of Communication with Friends and Family: Three times a week    Frequency of Social Gatherings with Friends and Family: Once a week    Attends Religious Services:  More than 4 times per year    Active Member of [employer] or Organizations: Yes    Attends Banker Meetings: 1 to 4 times per year    Marital Status: Married  Catering manager Violence: Not At Risk (01/30/2024)   Humiliation, Afraid, Rape, and Kick questionnaire    Fear of Current or Ex-Partner: No    Emotionally Abused: No    Physically Abused: No    Sexually Abused: No   Family Status  Relation Name Status   Mother  Alive   Father  Alive   Daughter  (Not Specified)   MGM  (Not Specified)   Other  (Not Specified)   Neg Hx  (Not Specified)  No partnership data on file   Family History  Problem Relation Age of Onset   Healthy Mother    Healthy Daughter    Heart disease Maternal Grandmother    Diabetes Maternal Grandmother    Breast cancer Other        mgaunt   Ovarian cancer Neg Hx    Colon cancer Neg Hx    No Known Allergies   Medications: Outpatient Medications Prior to Visit  Medication Sig   fluticasone  (FLONASE ) 50 MCG/ACT nasal spray Place 2 sprays into both nostrils daily.   Plecanatide  (TRULANCE ) 3 MG TABS Take 1 tablet by mouth daily at 2 PM.   sertraline  (ZOLOFT ) 50 MG tablet Take 1 tablet (50 mg total) by mouth daily.   Ubrogepant  (UBRELVY ) 50 MG TABS TAKE 1 TABLET BY MOUTH DAILY.   Vitamin D , Ergocalciferol , (DRISDOL ) 1.25 MG (50000 UNIT) CAPS capsule Take 1 capsule (50,000 Units total) by mouth 2 (two) times a week.   No facility-administered medications prior to visit.    Review of Systems  Last CBC Lab Results  Component Value Date   WBC 5.8 03/16/2023   HGB 12.4 03/16/2023   HCT 36.1 03/16/2023   MCV 95 03/16/2023   MCH 32.6 03/16/2023   RDW 11.5 (L) 03/16/2023   PLT 258 03/16/2023   Last metabolic panel Lab Results  Component Value Date   GLUCOSE 82 03/11/2022   NA 138 03/11/2022   K 4.3 03/11/2022   CL 104 03/11/2022   CO2 20 03/11/2022   BUN 9 03/11/2022   CREATININE 0.89 03/11/2022   EGFR 87 03/11/2022   CALCIUM 9.3  03/11/2022   PROT 7.5 03/11/2022   ALBUMIN 4.5 03/11/2022   LABGLOB 3.0 03/11/2022   AGRATIO 1.5 03/11/2022   BILITOT 0.3 03/11/2022   ALKPHOS 48 03/11/2022   AST 14 03/11/2022   ALT 19 03/11/2022   Last lipids Lab Results  Component Value Date   CHOL 200 (H) 03/11/2022   HDL 70 03/11/2022   LDLCALC 117 (H) 03/11/2022   TRIG 74  03/11/2022   CHOLHDL 2.9 03/11/2022   Last hemoglobin A1c Lab Results  Component Value Date   HGBA1C 4.9 03/07/2020   Last thyroid  functions Lab Results  Component Value Date   TSH 1.580 03/16/2023   Last vitamin D  Lab Results  Component Value Date   VD25OH 13.4 (L) 03/16/2023   Last vitamin B12 and Folate Lab Results  Component Value Date   VITAMINB12 538 03/16/2023       Objective    BP (!) 94/37 (BP Location: Right Arm, Patient Position: Sitting, Cuff Size: Normal)   Pulse 73   Resp 16   Ht 4' 9 (1.448 m)   Wt 154 lb 6.4 oz (70 kg)   SpO2 100%   BMI 33.41 kg/m   BP Readings from Last 3 Encounters:  05/01/24 (!) 94/37  01/30/24 125/88  07/19/23 130/86   Wt Readings from Last 3 Encounters:  05/01/24 154 lb 6.4 oz (70 kg)  01/30/24 160 lb (72.6 kg)  07/19/23 153 lb 12.8 oz (69.8 kg)        Physical Exam Vitals reviewed.  Constitutional:      General: She is not in acute distress.    Appearance: Normal appearance. She is not ill-appearing, toxic-appearing or diaphoretic.  HENT:     Head: Normocephalic and atraumatic.     Right Ear: Tympanic membrane and external ear normal. There is no impacted cerumen.     Left Ear: Tympanic membrane and external ear normal. There is no impacted cerumen.     Nose: Nose normal.     Mouth/Throat:     Pharynx: Oropharynx is clear.  Eyes:     General: No scleral icterus.    Extraocular Movements: Extraocular movements intact.     Conjunctiva/sclera: Conjunctivae normal.     Pupils: Pupils are equal, round, and reactive to light.  Cardiovascular:     Rate and Rhythm: Normal rate  and regular rhythm.     Pulses: Normal pulses.     Heart sounds: Normal heart sounds. No murmur heard.    No friction rub. No gallop.  Pulmonary:     Effort: Pulmonary effort is normal. No respiratory distress.     Breath sounds: Normal breath sounds. No wheezing, rhonchi or rales.  Abdominal:     General: Bowel sounds are normal. There is no distension.     Palpations: Abdomen is soft. There is no mass.     Tenderness: There is no abdominal tenderness. There is no guarding.  Musculoskeletal:        General: No deformity.     Cervical back: Normal range of motion and neck supple.     Right lower leg: No edema.     Left lower leg: No edema.  Lymphadenopathy:     Cervical: No cervical adenopathy.  Skin:    General: Skin is warm.     Capillary Refill: Capillary refill takes less than 2 seconds.     Findings: No erythema or rash.  Neurological:     General: No focal deficit present.     Mental Status: She is alert and oriented to person, place, and time.     Cranial Nerves: Cranial nerves 2-12 are intact. No cranial nerve deficit or facial asymmetry.     Motor: Motor function is intact. No weakness.     Gait: Gait normal.  Psychiatric:        Mood and Affect: Mood normal.        Behavior: Behavior normal.  Last depression screening scores    05/01/2024    8:57 AM 01/30/2024    3:48 PM 07/19/2023    4:03 PM  PHQ 2/9 Scores  PHQ - 2 Score 0 1 2  PHQ- 9 Score 4 4 13     Last fall risk screening    04/29/2023    9:37 AM  Fall Risk   Falls in the past year? 0  Number falls in past yr: 0  Injury with Fall? 0  Risk for fall due to : No Fall Risks  Follow up Falls evaluation completed    Last Audit-C alcohol use screening    04/30/2024    4:51 PM  Alcohol Use Disorder Test (AUDIT)  1. How often do you have a drink containing alcohol? 0  3. How often do you have six or more drinks on one occasion? 0   A score of 3 or more in women, and 4 or more in men indicates  increased risk for alcohol abuse, EXCEPT if all of the points are from question 1   No results found for any visits on 05/01/24.  Assessment & Plan    Routine Health Maintenance and Physical Exam  Immunization History  Administered Date(s) Administered   Fluzone Influenza virus vaccine,trivalent (IIV3), split virus 07/07/2012, 07/05/2013   Hepatitis B, ADULT 11/09/2010, 12/17/2010, 06/18/2011, 07/11/2012   Influenza,inj,Quad PF,6+ Mos 05/23/2015   Influenza-Unspecified 06/11/2018, 06/14/2019, 07/03/2019, 07/10/2020, 07/07/2021, 06/18/2023   MMR 04/27/1988, 05/05/1992   PFIZER(Purple Top)SARS-COV-2 Vaccination 11/09/2019, 11/30/2019, 08/01/2020   PPD Test 03/19/2013   Pfizer Covid-19 Vaccine Bivalent Booster 62yrs & up 08/28/2021   Tdap 11/09/2010, 03/07/2015   Varicella 11/09/2010, 05/24/2022    Health Maintenance  Topic Date Due   HPV VACCINES (1 - 3-dose SCDM series) Never done   COVID-19 Vaccine (5 - 2024-25 season) 05/15/2023   INFLUENZA VACCINE  04/13/2024   DTaP/Tdap/Td (3 - Td or Tdap) 03/06/2025   Cervical Cancer Screening (HPV/Pap Cotest)  07/25/2025   Hepatitis B Vaccines 19-59 Average Risk  Completed   Hepatitis C Screening  Completed   HIV Screening  Completed   Pneumococcal Vaccine  Aged Out   Meningococcal B Vaccine  Aged Out    Problem List Items Addressed This Visit       Digestive   Chronic constipation   Relevant Orders   CMP14+EGFR     Other   Vitamin D  deficiency   Vitamin D  deficiency. - Order vitamin D  levels.      Relevant Orders   VITAMIN D  25 Hydroxy (Vit-D Deficiency, Fractures)   Elevated LDL cholesterol level   Elevated LDL cholesterol. Lab Results  Component Value Date   CHOL 200 (H) 03/11/2022   HDL 70 03/11/2022   LDLCALC 117 (H) 03/11/2022   TRIG 74 03/11/2022   CHOLHDL 2.9 03/11/2022    - Assess current cholesterol levels to guide management. - Order lipid panel.      Relevant Orders   Lipid panel   Class 1 obesity  due to excess calories with body mass index (BMI) of 33.0 to 33.9 in adult   Obesity, class 1 BMI is 33.41, indicating class 1 obesity. - Discussed the importance of increasing physical activity and improving dietary habits to manage weight. - Encouraged to set small, achievable goals for exercise and diet modifications.      Relevant Orders   CMP14+EGFR   Annual physical exam - Primary   Adult Wellness Visit Annual physical examination conducted. BMI is 33.41, indicating  class 1 obesity. Current physical activity includes 7,000-8,000 steps per day, with a goal to increase to 10,000 steps. Diet includes fruits and vegetables but high fast food consumption due to busy schedule. - Encouraged 150 minutes of exercise per week and a well-balanced diet. - Plans to incorporate meal preparation and optimize lunch breaks for walking. - Recommended seasonal flu vaccine, HPV vaccine, and COVID vaccine. - HPV vaccine discussed as a preventive measure against cervical, head and neck, and genital cancers, with a three-dose schedule. - Informed about the extension of HPV vaccine guidelines to age 53 and offered information to consider vaccination. - Order CBC to screen for anemia. - Order A1c to screen for diabetes. - Order CMP to assess liver, kidney function, and electrolytes. - Order lipid panel to assess cholesterol levels. - Order vitamin D  levels. - last pap completed by GYN in 2021, pt will follow up with GYN       ADHD (attention deficit hyperactivity disorder)   Relevant Orders   CMP14+EGFR   Other Visit Diagnoses       Screening for deficiency anemia       Relevant Orders   CBC     Screening for diabetes mellitus       Relevant Orders   Hemoglobin A1c        Assessment & Plan    Hypotension Blood pressure recorded at 94/37, indicating hypotension. Reports normally having low blood pressure, but not typically this low. - Recheck blood pressure. Improved to  110/60s       Return in about 6 months (around 11/01/2024) for Depression.       Rockie Agent, MD  [facility] [phone] (phone) [phone] (fax)  [facility]

## 2024-05-01 NOTE — Assessment & Plan Note (Signed)
 Obesity, class 1 BMI is 33.41, indicating class 1 obesity. - Discussed the importance of increasing physical activity and improving dietary habits to manage weight. - Encouraged to set small, achievable goals for exercise and diet modifications.

## 2024-05-01 NOTE — Assessment & Plan Note (Signed)
 Elevated LDL cholesterol. Lab Results  Component Value Date   CHOL 200 (H) 03/11/2022   HDL 70 03/11/2022   LDLCALC 117 (H) 03/11/2022   TRIG 74 03/11/2022   CHOLHDL 2.9 03/11/2022    - Assess current cholesterol levels to guide management. - Order lipid panel.

## 2024-05-01 NOTE — Patient Instructions (Signed)
 It was a pleasure to see you today!  Thank you for choosing [facility] for your primary care.   Today you were seen for your annual physical  Please review the attached information regarding helpful preventive health topics.   To keep you healthy, please keep in mind the following health maintenance items that you are due for:   Health Maintenance Due  Topic Date Due   HPV VACCINES (1 - 3-dose SCDM series) Never done   COVID-19 Vaccine (5 - 2024-25 season) 05/15/2023   INFLUENZA VACCINE  04/13/2024     Best Wishes,   Dr. Lang

## 2024-05-02 ENCOUNTER — Ambulatory Visit: Payer: Self-pay | Admitting: Family Medicine

## 2024-05-02 ENCOUNTER — Encounter: Payer: 59 | Admitting: Nurse Practitioner

## 2024-05-02 DIAGNOSIS — R0683 Snoring: Secondary | ICD-10-CM

## 2024-05-02 DIAGNOSIS — Z6835 Body mass index (BMI) 35.0-35.9, adult: Secondary | ICD-10-CM

## 2024-05-02 LAB — HEMOGLOBIN A1C
Est. average glucose Bld gHb Est-mCnc: 88 mg/dL
Hgb A1c MFr Bld: 4.7 % — ABNORMAL LOW (ref 4.8–5.6)

## 2024-05-02 LAB — CMP14+EGFR
ALT: 16 IU/L (ref 0–32)
AST: 19 IU/L (ref 0–40)
Albumin: 4.3 g/dL (ref 3.9–4.9)
Alkaline Phosphatase: 47 IU/L (ref 44–121)
BUN/Creatinine Ratio: 7 — ABNORMAL LOW (ref 9–23)
BUN: 7 mg/dL (ref 6–20)
Bilirubin Total: 0.4 mg/dL (ref 0.0–1.2)
CO2: 19 mmol/L — ABNORMAL LOW (ref 20–29)
Calcium: 9.5 mg/dL (ref 8.7–10.2)
Chloride: 106 mmol/L (ref 96–106)
Creatinine, Ser: 0.94 mg/dL (ref 0.57–1.00)
Globulin, Total: 3 g/dL (ref 1.5–4.5)
Glucose: 94 mg/dL (ref 70–99)
Potassium: 4.3 mmol/L (ref 3.5–5.2)
Sodium: 139 mmol/L (ref 134–144)
Total Protein: 7.3 g/dL (ref 6.0–8.5)
eGFR: 80 mL/min/1.73 (ref >59)

## 2024-05-02 LAB — CBC
Hematocrit: 41.1 % (ref 34.0–46.6)
Hemoglobin: 13.2 g/dL (ref 11.1–15.9)
MCH: 31.7 pg (ref 26.6–33.0)
MCHC: 32.1 g/dL (ref 31.5–35.7)
MCV: 99 fL — ABNORMAL HIGH (ref 79–97)
Platelets: 253 x10E3/uL (ref 150–450)
RBC: 4.17 x10E6/uL (ref 3.77–5.28)
RDW: 11.6 % — ABNORMAL LOW (ref 11.7–15.4)
WBC: 4.6 x10E3/uL (ref 3.4–10.8)

## 2024-05-02 LAB — LIPID PANEL
Chol/HDL Ratio: 2.9 ratio (ref 0.0–4.4)
Cholesterol, Total: 199 mg/dL (ref 100–199)
HDL: 68 mg/dL (ref >39)
LDL Chol Calc (NIH): 120 mg/dL — ABNORMAL HIGH (ref 0–99)
Triglycerides: 58 mg/dL (ref 0–149)
VLDL Cholesterol Cal: 11 mg/dL (ref 5–40)

## 2024-05-02 LAB — VITAMIN D 25 HYDROXY (VIT D DEFICIENCY, FRACTURES): Vit D, 25-Hydroxy: 31.3 ng/mL (ref 30.0–100.0)

## 2024-07-20 ENCOUNTER — Encounter: Payer: Self-pay | Admitting: Family Medicine

## 2024-07-27 ENCOUNTER — Other Ambulatory Visit: Payer: Self-pay | Admitting: Family Medicine

## 2024-07-27 DIAGNOSIS — G4733 Obstructive sleep apnea (adult) (pediatric): Secondary | ICD-10-CM

## 2024-07-27 NOTE — Progress Notes (Signed)
 Orders placed for patient to start CPAP therapy for mild OSA

## 2024-07-31 NOTE — Telephone Encounter (Signed)
 Have submitted order with parachute and faxed documents plus sleep study

## 2024-08-01 ENCOUNTER — Other Ambulatory Visit: Payer: Self-pay

## 2024-08-01 MED ORDER — LO LOESTRIN FE 1 MG-10 MCG / 10 MCG PO TABS
1.0000 | ORAL_TABLET | Freq: Every morning | ORAL | 3 refills | Status: AC
  Filled 2024-08-01: qty 84, 84d supply, fill #0

## 2024-08-02 ENCOUNTER — Other Ambulatory Visit: Payer: Self-pay | Admitting: Family Medicine

## 2024-08-02 ENCOUNTER — Other Ambulatory Visit: Payer: Self-pay

## 2024-08-03 ENCOUNTER — Other Ambulatory Visit: Payer: Self-pay

## 2024-08-03 MED FILL — Sertraline HCl Tab 50 MG: ORAL | 90 days supply | Qty: 90 | Fill #0 | Status: AC

## 2024-08-08 ENCOUNTER — Other Ambulatory Visit: Payer: Self-pay

## 2024-08-20 DIAGNOSIS — G4733 Obstructive sleep apnea (adult) (pediatric): Secondary | ICD-10-CM | POA: Diagnosis not present

## 2024-10-19 ENCOUNTER — Other Ambulatory Visit: Payer: Self-pay

## 2024-10-19 MED ORDER — XIIDRA 5 % OP SOLN
1.0000 [drp] | Freq: Two times a day (BID) | OPHTHALMIC | 3 refills | Status: AC
  Filled 2024-10-19: qty 180, 90d supply, fill #0

## 2024-11-02 ENCOUNTER — Ambulatory Visit: Admitting: Family Medicine

## 2024-11-13 ENCOUNTER — Ambulatory Visit: Admitting: Family Medicine
